# Patient Record
Sex: Female | Born: 1975 | Race: Black or African American | Hispanic: No | Marital: Single | Smoking: Never smoker
Health system: Southern US, Community
[De-identification: ages and names within clinical notes are randomized; demographics above are authoritative.]

## PROBLEM LIST (undated history)

## (undated) DIAGNOSIS — I499 Cardiac arrhythmia, unspecified: Secondary | ICD-10-CM

## (undated) DIAGNOSIS — K219 Gastro-esophageal reflux disease without esophagitis: Secondary | ICD-10-CM

## (undated) DIAGNOSIS — E049 Nontoxic goiter, unspecified: Secondary | ICD-10-CM

## (undated) DIAGNOSIS — D219 Benign neoplasm of connective and other soft tissue, unspecified: Secondary | ICD-10-CM

## (undated) DIAGNOSIS — Z87442 Personal history of urinary calculi: Secondary | ICD-10-CM

## (undated) DIAGNOSIS — R51 Headache: Secondary | ICD-10-CM

## (undated) DIAGNOSIS — E039 Hypothyroidism, unspecified: Secondary | ICD-10-CM

## (undated) HISTORY — PX: BREAST SURGERY: SHX581

## (undated) HISTORY — PX: CYSTOSCOPY W/ STONE MANIPULATION: SHX1427

---
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
## 1993-12-21 HISTORY — PX: BREAST REDUCTION SURGERY: SHX8

## 2004-12-21 HISTORY — PX: MYOMECTOMY: SHX85

## 2012-07-05 ENCOUNTER — Other Ambulatory Visit: Payer: Self-pay | Admitting: Otolaryngology

## 2012-07-05 DIAGNOSIS — E041 Nontoxic single thyroid nodule: Secondary | ICD-10-CM

## 2012-07-13 ENCOUNTER — Ambulatory Visit
Admission: RE | Admit: 2012-07-13 | Discharge: 2012-07-13 | Disposition: A | Discharge status: Home / Self Care | Payer: BC Managed Care – PPO | Source: Ambulatory Visit | Attending: Otolaryngology | Admitting: Otolaryngology

## 2012-07-13 ENCOUNTER — Other Ambulatory Visit: Payer: Self-pay | Admitting: Otolaryngology

## 2012-07-13 ENCOUNTER — Other Ambulatory Visit (HOSPITAL_COMMUNITY)
Admission: RE | Admit: 2012-07-13 | Discharge: 2012-07-13 | Disposition: A | Discharge status: Home / Self Care | Payer: BC Managed Care – PPO | Source: Ambulatory Visit | Attending: Diagnostic Radiology | Admitting: Diagnostic Radiology

## 2012-07-13 DIAGNOSIS — E041 Nontoxic single thyroid nodule: Secondary | ICD-10-CM

## 2012-07-13 DIAGNOSIS — E042 Nontoxic multinodular goiter: Secondary | ICD-10-CM

## 2012-07-13 DIAGNOSIS — E049 Nontoxic goiter, unspecified: Secondary | ICD-10-CM | POA: Insufficient documentation

## 2012-08-31 ENCOUNTER — Encounter (HOSPITAL_COMMUNITY): Payer: Self-pay | Admitting: Pharmacist

## 2012-09-01 ENCOUNTER — Encounter (HOSPITAL_COMMUNITY): Payer: Self-pay

## 2012-09-01 ENCOUNTER — Encounter (HOSPITAL_COMMUNITY)
Admission: RE | Admit: 2012-09-01 | Discharge: 2012-09-01 | Disposition: A | Discharge status: Home / Self Care | Payer: BC Managed Care – PPO | Source: Ambulatory Visit | Attending: Otolaryngology | Admitting: Otolaryngology

## 2012-09-01 HISTORY — DX: Headache: R51

## 2012-09-01 HISTORY — DX: Nontoxic goiter, unspecified: E04.9

## 2012-09-01 LAB — HCG, SERUM, QUALITATIVE: Preg, Serum: NEGATIVE

## 2012-09-01 LAB — CBC WITH DIFFERENTIAL/PLATELET
Basophils Absolute: 0 K/uL (ref 0.0–0.1)
Basophils Relative: 0 % (ref 0–1)
Eosinophils Absolute: 0.1 K/uL (ref 0.0–0.7)
Eosinophils Relative: 2 % (ref 0–5)
HCT: 39.3 % (ref 36.0–46.0)
Hemoglobin: 13 g/dL (ref 12.0–15.0)
Lymphocytes Relative: 39 % (ref 12–46)
Lymphs Abs: 3.6 K/uL (ref 0.7–4.0)
MCH: 29 pg (ref 26.0–34.0)
MCHC: 33.1 g/dL (ref 30.0–36.0)
MCV: 87.7 fL (ref 78.0–100.0)
Monocytes Absolute: 0.6 K/uL (ref 0.1–1.0)
Monocytes Relative: 7 % (ref 3–12)
Neutro Abs: 4.9 K/uL (ref 1.7–7.7)
Neutrophils Relative %: 53 % (ref 43–77)
Platelets: 379 K/uL (ref 150–400)
RBC: 4.48 MIL/uL (ref 3.87–5.11)
RDW: 14.2 % (ref 11.5–15.5)
WBC: 9.3 K/uL (ref 4.0–10.5)

## 2012-09-01 LAB — SURGICAL PCR SCREEN
MRSA, PCR: NEGATIVE
Staphylococcus aureus: NEGATIVE

## 2012-09-01 LAB — BASIC METABOLIC PANEL
BUN: 10 mg/dL (ref 6–23)
Chloride: 105 mEq/L (ref 96–112)
Creatinine, Ser: 0.67 mg/dL (ref 0.50–1.10)
GFR calc Af Amer: >90 mL/min (ref >90)

## 2012-09-01 LAB — BASIC METABOLIC PANEL WITH GFR
CO2: 25 meq/L (ref 19–32)
Calcium: 9.9 mg/dL (ref 8.4–10.5)
GFR calc non Af Amer: >90 mL/min (ref >90)
Glucose, Bld: 121 mg/dL — ABNORMAL HIGH (ref 70–99)
Potassium: 4.2 meq/L (ref 3.5–5.1)
Sodium: 141 meq/L (ref 135–145)

## 2012-09-01 NOTE — Pre-Procedure Instructions (Signed)
20 Rebecca Campos  09/01/2012   Your procedure is scheduled on:  Sept 25, 2013  Report to [facility] at 6:30 AM.  Call this number if you have problems the morning of surgery: [phone]   Remember:   Do not eat food:After Midnight.    Take these medicines the morning of surgery with A SIP OF WATER: allegra, prilosec  Do not wear jewelry, make-up or nail polish.  Do not wear lotions, powders, or perfumes. You may wear deodorant.  Do not shave 48 hours prior to surgery. Men may shave face and neck.  Do not bring valuables to the hospital.  Contacts, dentures or bridgework may not be worn into surgery.  Leave suitcase in the car. After surgery it may be brought to your room.  For patients admitted to the hospital, checkout time is 11:00 AM the day of discharge.   Patients discharged the day of surgery will not be allowed to drive home.  Name and phone number of your driver:   Special Instructions: CHG Shower Use Special Wash: 1/2 bottle night before surgery and 1/2 bottle morning of surgery.   Please read over the following fact sheets that you were given: Pain Booklet, Coughing and Deep Breathing and Surgical Site Infection Prevention

## 2012-09-13 ENCOUNTER — Other Ambulatory Visit: Payer: Self-pay | Admitting: Otolaryngology

## 2012-09-13 NOTE — H&P (Signed)
NAME:  Rebecca Campos, Rebecca Campos NO.:  [id number]  MEDICAL RECORD NO.:  [id number]  LOCATION:  [facility]                        FACILITY:  [facility]  PHYSICIAN:  Hermelinda Medicus, M.D.   DATE OF BIRTH:  [birth year]  DATE OF ADMISSION:  09/14/2012 DATE OF DISCHARGE:                             HISTORY & PHYSICAL   HISTORY OF PRESENT ILLNESS:  This patient is a 36 year old female, who we re-biopsied a large thyroid nodule in the right thyroid as well as the isthmus lesion biopsy.  The right side showed a 6 x 4.6 x 4.6 cm and the isthmus showed a 4.1 x 3 x 1.4 cm hyperplastic nodule.  The patient is feeling the fullness of that right side and also considerably shows filling of pressure in her trachea, difficulty swallowing, and finds her voice is in good condition and is stable but she still feels her voice is somewhat changed.  This apparently has been enlarging but has been a issue for approximately 6 years.  Because of this pressure situation and because of the fact of the size of this, all this area could not be needle biopsied it.  It came back as a multinodular goiter on pathology report, but is still is of concern in terms of clinical symptoms and the size of structure.  She now enters for right subtotal thyroidectomy with inclusion of the isthmus to relieve the pressure sensation hopefully will help her voice situation and she has been well warned that she could have some because of the pressure situation on the recurrent nerve, she could have some voice issues postop.  She is tentatively taking some approximately 2 weeks as she does use her voice at work her.  MEDICATIONS: 1. Naproxen 500 mg oral tablet every 12 hours as needed. 2. Acyclovir 400 mg oral tablet for fever, blister. 3. She takes Excedrin Migraine 250/250/65. 4. Omeprazole 20. 5. Fluticasone propionate 50 mcg. 6. Veramyst nasal suspension.  She has allergic rhinitis.  She has a history of esophageal  reflux, herpes simplex, migraine headache, osteoarthritis, and of course the goiter.  She has had breast surgery.  She has had tonsillectomy and uterine myomectomy.  ALLERGIES:  She has no allergies to medications.  FAMILY HISTORY:  Hepatitis, diabetes mellitus, thyroid disorder.  REVIEW OF SYSTEMS:  Essentially is some difficulty swallowing, painful swallowing, but states has had some hair loss, but her GI, GU, musculoskeletal, integument, neurologic, psychiatric, and allergy is really quite unremarkable.  PHYSICAL EXAMINATION:  GENERAL:  She is a well-nourished, well-developed female in no acute distress.  VITAL SIGNS:  Her blood pressure 140/80, her weight is 230, BMI 44.9 per Dr. Katrinka Blazing. HEENT:  Her ears are completely clear.  Tympanic membranes look excellent and move well.  Her nose is clear of any ulceration, lesion, or mass.  Nasopharynx is completely clear.  Her larynx is clear.  True cords, false cords, epiglottis, base of tongue, lateral pharyngeal walls are free of ulceration, mass, or lesion.  I cannot see a vocal cord abnormality but she states her voice is somewhat weaker than it is in the past.  She is oriented x3. NECK:  Free of any cervical adenopathy but has a  large thyroid on the right, which extends over to the isthmus region. CHEST:  Clear.  No rales, rhonchi, or wheezes. CARDIOVASCULAR:  No evidence of murmurs or gallops. ABDOMEN:  Unremarkable. EXTREMITIES:  Unremarkable. NEUROLOGIC:  Unremarkable as above stated.  Initial diagnosis is multinodular goiter, dominant on the right and mid region, history of tonsillectomy, history of breast reduction, and history of myomectomy for fibroids, history of headaches.  Family history of diabetes and hepatitis.          ______________________________ Hermelinda Medicus, M.D.     JC/MEDQ  D:  09/13/2012  T:  09/13/2012  Job:  [id number]  cc:   Corwin Levins, MD Gildardo Griffes, MD

## 2012-09-14 ENCOUNTER — Encounter (HOSPITAL_COMMUNITY): Payer: Self-pay | Admitting: *Deleted

## 2012-09-14 ENCOUNTER — Encounter (HOSPITAL_COMMUNITY)
Admission: RE | Disposition: A | Discharge status: Home / Self Care | Payer: Self-pay | Source: Ambulatory Visit | Attending: Otolaryngology

## 2012-09-14 ENCOUNTER — Encounter (HOSPITAL_COMMUNITY): Payer: Self-pay | Admitting: Otolaryngology

## 2012-09-14 ENCOUNTER — Encounter (HOSPITAL_COMMUNITY): Payer: Self-pay | Admitting: Critical Care Medicine

## 2012-09-14 ENCOUNTER — Ambulatory Visit (HOSPITAL_COMMUNITY): Payer: BC Managed Care – PPO | Admitting: Critical Care Medicine

## 2012-09-14 ENCOUNTER — Ambulatory Visit (HOSPITAL_COMMUNITY)
Admission: RE | Admit: 2012-09-14 | Discharge: 2012-09-15 | Disposition: A | Discharge status: Home / Self Care | Payer: BC Managed Care – PPO | Source: Ambulatory Visit | Attending: Otolaryngology | Admitting: Otolaryngology

## 2012-09-14 DIAGNOSIS — E049 Nontoxic goiter, unspecified: Secondary | ICD-10-CM

## 2012-09-14 DIAGNOSIS — I499 Cardiac arrhythmia, unspecified: Secondary | ICD-10-CM

## 2012-09-14 DIAGNOSIS — Z01812 Encounter for preprocedural laboratory examination: Secondary | ICD-10-CM | POA: Insufficient documentation

## 2012-09-14 HISTORY — PX: THYROIDECTOMY: SHX17

## 2012-09-14 SURGERY — THYROIDECTOMY
Anesthesia: General | Site: Neck | Laterality: Right | Wound class: Clean

## 2012-09-14 MED ORDER — DEXAMETHASONE SODIUM PHOSPHATE 4 MG/ML IJ SOLN
INTRAMUSCULAR | Status: DC | PRN
  Administered 2012-09-14: 8 mg via INTRAVENOUS

## 2012-09-14 MED ORDER — IBUPROFEN 100 MG/5ML PO SUSP
400.0000 mg | Freq: Four times a day (QID) | ORAL | Status: DC | PRN
  Administered 2012-09-15: 400 mg via ORAL
  Filled 2012-09-14: qty 20

## 2012-09-14 MED ORDER — 0.9 % SODIUM CHLORIDE (POUR BTL) OPTIME
TOPICAL | Status: DC | PRN
  Administered 2012-09-14: 1000 mL

## 2012-09-14 MED ORDER — FENTANYL CITRATE 0.05 MG/ML IJ SOLN
INTRAMUSCULAR | Status: DC | PRN
  Administered 2012-09-14: 100 ug via INTRAVENOUS
  Administered 2012-09-14: 150 ug via INTRAVENOUS
  Administered 2012-09-14 (×2): 100 ug via INTRAVENOUS
  Administered 2012-09-14: 50 ug via INTRAVENOUS

## 2012-09-14 MED ORDER — CEFAZOLIN SODIUM 1-5 GM-% IV SOLN
1.0000 g | Freq: Three times a day (TID) | INTRAVENOUS | Status: DC
  Administered 2012-09-14 – 2012-09-15 (×3): 1 g via INTRAVENOUS
  Filled 2012-09-14 (×6): qty 50

## 2012-09-14 MED ORDER — FENTANYL CITRATE 0.05 MG/ML IJ SOLN
25.0000 ug | INTRAMUSCULAR | Status: DC | PRN
  Administered 2012-09-14 (×4): 25 ug via INTRAVENOUS

## 2012-09-14 MED ORDER — BACITRACIN ZINC 500 UNIT/GM EX OINT: 1.0000 "application " | TOPICAL_OINTMENT | Freq: Three times a day (TID) | CUTANEOUS | Status: DC

## 2012-09-14 MED ORDER — PROPOFOL INFUSION 10 MG/ML OPTIME
INTRAVENOUS | Status: DC | PRN
  Administered 2012-09-14: 10:00:00 via INTRAVENOUS
  Administered 2012-09-14: 75 ug/kg/min via INTRAVENOUS

## 2012-09-14 MED ORDER — ALBUTEROL SULFATE HFA 108 (90 BASE) MCG/ACT IN AERS
INHALATION_SPRAY | RESPIRATORY_TRACT | Status: DC | PRN
  Administered 2012-09-14: 2 via RESPIRATORY_TRACT

## 2012-09-14 MED ORDER — ONDANSETRON HCL 4 MG/2ML IJ SOLN: 4.0000 mg | INTRAMUSCULAR | Status: DC | PRN

## 2012-09-14 MED ORDER — ONDANSETRON HCL 4 MG PO TABS: 4.0000 mg | ORAL_TABLET | ORAL | Status: DC | PRN

## 2012-09-14 MED ORDER — LIDOCAINE-EPINEPHRINE 1 %-1:100000 IJ SOLN
INTRAMUSCULAR | Status: AC
  Filled 2012-09-14: qty 1

## 2012-09-14 MED ORDER — LACTATED RINGERS IV SOLN
INTRAVENOUS | Status: DC | PRN
  Administered 2012-09-14 (×2): via INTRAVENOUS

## 2012-09-14 MED ORDER — MINERAL OIL LIGHT 100 % EX OIL
TOPICAL_OIL | CUTANEOUS | Status: AC
  Filled 2012-09-14: qty 25

## 2012-09-14 MED ORDER — PANTOPRAZOLE SODIUM 40 MG PO TBEC
40.0000 mg | DELAYED_RELEASE_TABLET | Freq: Every day | ORAL | Status: DC
  Administered 2012-09-15: 40 mg via ORAL
  Filled 2012-09-14: qty 1

## 2012-09-14 MED ORDER — PROPOFOL 10 MG/ML IV BOLUS
INTRAVENOUS | Status: DC | PRN
  Administered 2012-09-14 (×2): 50 mg via INTRAVENOUS
  Administered 2012-09-14: 200 mg via INTRAVENOUS

## 2012-09-14 MED ORDER — BACITRACIN ZINC 500 UNIT/GM EX OINT
TOPICAL_OINTMENT | CUTANEOUS | Status: AC
Start: 2012-09-14 — End: 2012-09-14
  Filled 2012-09-14: qty 15

## 2012-09-14 MED ORDER — MIDAZOLAM HCL 5 MG/5ML IJ SOLN
INTRAMUSCULAR | Status: DC | PRN
  Administered 2012-09-14: 2 mg via INTRAVENOUS

## 2012-09-14 MED ORDER — SUCCINYLCHOLINE CHLORIDE 20 MG/ML IJ SOLN
INTRAMUSCULAR | Status: DC | PRN
  Administered 2012-09-14: 100 mg via INTRAVENOUS

## 2012-09-14 MED ORDER — PHENYLEPHRINE HCL 10 MG/ML IJ SOLN
INTRAMUSCULAR | Status: DC | PRN
  Administered 2012-09-14: 80 ug via INTRAVENOUS
  Administered 2012-09-14: 160 ug via INTRAVENOUS
  Administered 2012-09-14: 80 ug via INTRAVENOUS

## 2012-09-14 MED ORDER — BACITRACIN ZINC 500 UNIT/GM EX OINT
TOPICAL_OINTMENT | CUTANEOUS | Status: DC | PRN
  Administered 2012-09-14: 1 via TOPICAL

## 2012-09-14 MED ORDER — BACITRACIN ZINC 500 UNIT/GM EX OINT
1.0000 "application " | TOPICAL_OINTMENT | Freq: Three times a day (TID) | CUTANEOUS | Status: DC
  Administered 2012-09-15 (×2): 1 via TOPICAL
  Filled 2012-09-14: qty 15

## 2012-09-14 MED ORDER — FENTANYL CITRATE 0.05 MG/ML IJ SOLN
INTRAMUSCULAR | Status: AC
  Filled 2012-09-14: qty 2

## 2012-09-14 MED ORDER — IBUPROFEN 100 MG/5ML PO SUSP: 400.0000 mg | Freq: Four times a day (QID) | ORAL | Status: DC | PRN

## 2012-09-14 MED ORDER — MINERAL OIL LIGHT 100 % EX OIL
TOPICAL_OIL | CUTANEOUS | Status: DC | PRN
  Administered 2012-09-14: 1 via TOPICAL

## 2012-09-14 MED ORDER — CEFAZOLIN SODIUM 1-5 GM-% IV SOLN: 1.0000 g | Freq: Three times a day (TID) | INTRAVENOUS | Status: DC

## 2012-09-14 MED ORDER — IBUPROFEN 600 MG PO TABS: 600.0000 mg | ORAL_TABLET | Freq: Four times a day (QID) | ORAL | Status: DC | PRN

## 2012-09-14 MED ORDER — SODIUM CHLORIDE 0.45 % IV SOLN
INTRAVENOUS | Status: DC
  Administered 2012-09-14: 100 mL/h via INTRAVENOUS
  Administered 2012-09-15: 03:00:00 via INTRAVENOUS

## 2012-09-14 MED ORDER — LIDOCAINE HCL (CARDIAC) 20 MG/ML IV SOLN
INTRAVENOUS | Status: DC | PRN
  Administered 2012-09-14: 50 mg via INTRAVENOUS

## 2012-09-14 MED ORDER — INFLUENZA VIRUS VACC SPLIT PF IM SUSP
0.5000 mL | INTRAMUSCULAR | Status: DC
  Filled 2012-09-14: qty 0.5

## 2012-09-14 MED ORDER — TRAMADOL HCL 50 MG PO TABS
50.0000 mg | ORAL_TABLET | Freq: Four times a day (QID) | ORAL | Status: DC
  Administered 2012-09-14 – 2012-09-15 (×4): 50 mg via ORAL
  Filled 2012-09-14 (×7): qty 1

## 2012-09-14 MED ORDER — CEFAZOLIN SODIUM-DEXTROSE 2-3 GM-% IV SOLR
INTRAVENOUS | Status: DC | PRN
  Administered 2012-09-14: 2 g via INTRAVENOUS

## 2012-09-14 MED ORDER — ARTIFICIAL TEARS OP OINT
TOPICAL_OINTMENT | OPHTHALMIC | Status: DC | PRN
  Administered 2012-09-14: 1 via OPHTHALMIC

## 2012-09-14 MED ORDER — CEFAZOLIN SODIUM-DEXTROSE 2-3 GM-% IV SOLR
INTRAVENOUS | Status: AC
  Filled 2012-09-14: qty 50

## 2012-09-14 MED ORDER — HEMOSTATIC AGENTS (NO CHARGE) OPTIME
TOPICAL | Status: DC | PRN
  Administered 2012-09-14: 1 via TOPICAL

## 2012-09-14 MED ORDER — ONDANSETRON HCL 4 MG/2ML IJ SOLN
INTRAMUSCULAR | Status: DC | PRN
  Administered 2012-09-14 (×2): 4 mg via INTRAVENOUS

## 2012-09-14 SURGICAL SUPPLY — 47 items
CANISTER SUCTION 2500CC (MISCELLANEOUS) ×2 IMPLANT
CLEANER TIP ELECTROSURG 2X2 (MISCELLANEOUS) ×2 IMPLANT
CLOTH BEACON ORANGE TIMEOUT ST (SAFETY) ×2 IMPLANT
CLSR STERI-STRIP ANTIMIC 1/2X4 (GAUZE/BANDAGES/DRESSINGS) ×2 IMPLANT
CONT SPEC 4OZ CLIKSEAL STRL BL (MISCELLANEOUS) ×2 IMPLANT
CORDS BIPOLAR (ELECTRODE) ×2 IMPLANT
COVER SURGICAL LIGHT HANDLE (MISCELLANEOUS) ×2 IMPLANT
DRAIN SNY 7 FPER (WOUND CARE) ×2 IMPLANT
ELECT COATED BLADE 2.86 ST (ELECTRODE) ×2 IMPLANT
ELECT REM PT RETURN 9FT ADLT (ELECTROSURGICAL) ×2
ELECTRODE REM PT RTRN 9FT ADLT (ELECTROSURGICAL) ×1 IMPLANT
EVACUATOR SILICONE 100CC (DRAIN) ×2 IMPLANT
GAUZE SPONGE 4X4 16PLY XRAY LF (GAUZE/BANDAGES/DRESSINGS) IMPLANT
GLOVE BIO SURGEON STRL SZ7.5 (GLOVE) ×2 IMPLANT
GLOVE BIOGEL PI IND STRL 7.5 (GLOVE) ×1 IMPLANT
GLOVE BIOGEL PI INDICATOR 7.5 (GLOVE) ×1
GLOVE SS BIOGEL STRL SZ 7.5 (GLOVE) ×1 IMPLANT
GLOVE SUPERSENSE BIOGEL SZ 7.5 (GLOVE) ×1
GLOVE SURG SS PI 7.0 STRL IVOR (GLOVE) ×2 IMPLANT
GLOVE SURG SS PI 7.5 STRL IVOR (GLOVE) ×4 IMPLANT
GOWN PREVENTION PLUS XLARGE (GOWN DISPOSABLE) ×4 IMPLANT
GOWN STRL NON-REIN LRG LVL3 (GOWN DISPOSABLE) ×6 IMPLANT
HEMOSTAT SURGICEL 2X14 (HEMOSTASIS) IMPLANT
KIT BASIN OR (CUSTOM PROCEDURE TRAY) ×2 IMPLANT
KIT ROOM TURNOVER OR (KITS) ×2 IMPLANT
LOCATOR NERVE 3 VOLT (DISPOSABLE) IMPLANT
NS IRRIG 1000ML POUR BTL (IV SOLUTION) ×2 IMPLANT
PAD ARMBOARD 7.5X6 YLW CONV (MISCELLANEOUS) ×4 IMPLANT
PENCIL BUTTON HOLSTER BLD 10FT (ELECTRODE) ×2 IMPLANT
SPECIMEN JAR MEDIUM (MISCELLANEOUS) ×2 IMPLANT
SPONGE GAUZE 4X4 12PLY (GAUZE/BANDAGES/DRESSINGS) ×2 IMPLANT
SPONGE INTESTINAL PEANUT (DISPOSABLE) IMPLANT
STRIP CLOSURE SKIN 1/2X4 (GAUZE/BANDAGES/DRESSINGS) IMPLANT
SUT CHROMIC 2 0 SH (SUTURE) ×2 IMPLANT
SUT ETHILON 5 0 P 3 18 (SUTURE)
SUT NYLON ETHILON 5-0 P-3 1X18 (SUTURE) IMPLANT
SUT SILK 2 0 (SUTURE) ×1
SUT SILK 2 0 FS (SUTURE) ×2 IMPLANT
SUT SILK 2 0 SH CR/8 (SUTURE) ×2 IMPLANT
SUT SILK 2-0 18XBRD TIE 12 (SUTURE) ×1 IMPLANT
SUT SILK 3 0 (SUTURE)
SUT SILK 3-0 18XBRD TIE 12 (SUTURE) IMPLANT
TOWEL OR 17X24 6PK STRL BLUE (TOWEL DISPOSABLE) ×2 IMPLANT
TOWEL OR 17X26 10 PK STRL BLUE (TOWEL DISPOSABLE) ×2 IMPLANT
TRAY ENT MC OR (CUSTOM PROCEDURE TRAY) ×2 IMPLANT
TUBE ENDOTRAC EMG 7X10.2 (MISCELLANEOUS) ×2 IMPLANT
WATER STERILE IRR 1000ML POUR (IV SOLUTION) ×2 IMPLANT

## 2012-09-14 NOTE — Op Note (Signed)
NAME:  LANGLEY, FLATLEY NO.:  [id number]  MEDICAL RECORD NO.:  [id number]  LOCATION:  [facility]                         FACILITY:  [facility]  PHYSICIAN:  Hermelinda Medicus, M.D.   DATE OF BIRTH:  [birth year]  DATE OF PROCEDURE: DATE OF DISCHARGE:                              OPERATIVE REPORT   PREOPERATIVE DIAGNOSIS:  Right thyroid goiter with considerable pressure on the trachea and esophagus, 6 x 4.6 x 4.6 cm with involvement of the isthmus.  POSTOPERATIVE DIAGNOSIS:  Right thyroid goiter with considerable pressure on the trachea and esophagus, 6 x 4.6 x 4.6 cm with involvement of the isthmus.  OPERATION:  Right subtotal thyroidectomy.  OPERATOR:  Hermelinda Medicus, MD  ASSISTANT:  Kristine Garbe. Ezzard Standing, MD  PROCEDURE:  The patient was placed in a supine position.  A shoulder roll was placed and he had the back in the midline and the neck was prepped with Betadine x3.  The usual neck drape and head drape was used. Once the patient was positioned and also she was intubated using the NIMS endotracheal tube, using also the glide visualization.  Once this was completed, the patient was prepped and draped as above stated and then an incision was made in the neck crease as a standard thyroid incision.  This was carried down through the skin, platysma, musculature down to the strap muscles.  The strap muscles were identified and the very large lesion was also identified that was pressing the trachea over toward the left.  We mobilized the strap muscles and dissected back and placed the thyroid retractor. Then using the [employer] retractors, we were able to expose this goiter more effectively.  The size of this considering it had been there for approximately 6 years was very impressive.  She has had some slight voice issues with this as well as swallowing issues as well as a feeling of pressure in her trachea and this could be well understood when we see the size of this lesion.   The dissection was carried down to the trachea midline and the superior trachea and cricoid region.  The dissection was then carried inferiorly and all hemostasis was established with Bovie electrocoagulation and 2-0 ties were used otherwise.  As we dissected, we found the parathyroid and they were saved and we worked away around inferiorly and then laterally and around the backside of this very large mass.  We then worked our way up to superiorly across Berry's ligament area and were very careful to stay away from the trachea in this area.  Visualization was somewhat difficult because of the size of this mass but we stayed away from that key area around Berry's ligament and keeping the recurrent nerve well guarded.  We worked back around the tracheoesophageal groove area, again being very careful around the recurrent nerve region being dissecting only right on the mass and dissected it back off in an area that we were able to elevate the inferior pole and then were able to see more clearly the superior pole and dissect further again saving what appeared to be the parathyroid tissue.  As we developed this, we used the bipolar Bovie electrocoagulation as well  as the standard Bovie.  Hemostasis was well controlled throughout this procedure.  The loss of blood was essentially 75 mL and once we dissected the entire lesion free, then we could visualize very well and did not try to identify fully the recurrent nerve as there was considerable pressure on this area and we felt that the minimal trauma would be appropriate.  The hemostasis was established with Bovie electrocoagulation and then the bipolar cautery.  The trachea and Berry's ligament area was having no area of concern or problem and then once the hemostasis was completely established, we placed some Surgicel inferiorly and Surgicel superiorly because of the straight size of this lesion.  We then closed with 2-0 chromic catgut and then  5-0 Ethilon.  We placed a Jackson-Pratt flat multi-perforated drain and the loss of blood during this placement of the drain was minimal.  Once we closed the incision, we placed Steri-Strips.  The drain was sutured in place with a 2-0 silk.  The patient is aware of the risks and gains. Because of the size of this lesion, we were concerned about the recurrent nerve pressure.  There was pressure on the esophagus as well as the trachea and therefore she may have some further voice issues of which she is aware.  However, she waited for approximately 6 years on this, trying to avoid the surgery but the pressure there just became too much of a problem.  Her followup of course will be in the hospital.  She is somewhat overweight.  She has a BMI of 44.9, weight 230 and with this large lesion, we may place her in step-down.  We will decide that in the recovery area.          ______________________________ Hermelinda Medicus, M.D.     JC/MEDQ  D:  09/14/2012  T:  09/14/2012  Job:  [id number]  cc:   Corwin Levins, MD Gildardo Griffes, MD Kristine Garbe. Ezzard Standing, M.D.

## 2012-09-14 NOTE — Anesthesia Postprocedure Evaluation (Signed)
  Anesthesia Post-op Note  Patient: Rebecca Campos  Procedure(s) Performed: Procedure(s) (LRB) with comments: THYROIDECTOMY (Right) - Right subtotal thyroidectomy   Patient Location: PACU  Anesthesia Type: General  Level of Consciousness: awake  Airway and Oxygen Therapy: Patient Spontanous Breathing  Post-op Pain: mild  Post-op Assessment: Post-op Vital signs reviewed  Post-op Vital Signs: Reviewed  Complications: No apparent anesthesia complications

## 2012-09-14 NOTE — Transfer of Care (Signed)
Immediate Anesthesia Transfer of Care Note  Patient: Rebecca Campos  Procedure(s) Performed: Procedure(s) (LRB) with comments: THYROIDECTOMY (Right) - Right subtotal thyroidectomy   Patient Location: PACU  Anesthesia Type: General  Level of Consciousness: awake, alert  and oriented  Airway & Oxygen Therapy: Patient Spontanous Breathing and Patient connected to nasal cannula oxygen  Post-op Assessment: Report given to PACU RN, Post -op Vital signs reviewed and stable and Patient moving all extremities X 4  Post vital signs: Reviewed and stable  Complications: No apparent anesthesia complications

## 2012-09-14 NOTE — Plan of Care (Signed)
Problem: Phase I Progression Outcomes Goal: OOB as tolerated unless otherwise ordered Outcome: Completed/Met Date Met:  09/14/12 Pt ambulating to bathroom without complications

## 2012-09-14 NOTE — Op Note (Signed)
09/14/2012  11:02 AM  PATIENT:  Rebecca Campos  36 y.o. female  PRE-OPERATIVE DIAGNOSIS:  Large thyroid nodule  POST-OPERATIVE DIAGNOSIS:  Large thyroid nodule  PROCEDURE:  Procedure(s): THYROIDECTOMY  SURGEON:  Surgeon(s): Keturah Barre, MD Drema Halon, MD   DICTATION JOB # [id number]

## 2012-09-14 NOTE — Anesthesia Procedure Notes (Signed)
Procedure Name: Intubation Date/Time: 09/14/2012 8:39 AM Performed by: Elon Alas Pre-anesthesia Checklist: Patient identified, Timeout performed, Emergency Drugs available, Suction available and Patient being monitored Patient Re-evaluated:Patient Re-evaluated prior to inductionOxygen Delivery Method: Circle system utilized Preoxygenation: Pre-oxygenation with 100% oxygen Intubation Type: IV induction Ventilation: Mask ventilation without difficulty Grade View: Grade I Tube type: Oral Number of attempts: 1 Airway Equipment and Method: Stylet Placement Confirmation: positive ETCO2,  ETT inserted through vocal cords under direct vision and breath sounds checked- equal and bilateral Secured at: 22 cm Tube secured with: Tape Dental Injury: Teeth and Oropharynx as per pre-operative assessment and Bloody posterior oropharynx  Comments: Pt had edematous, friable tissue in posterior hypopharynx,

## 2012-09-14 NOTE — Progress Notes (Signed)
Patient doing well with excellent voice minimal pain  JP drain working well with 30cc drainage  No neck swelling Placeed in step down as snoring history and a large thyroid mass [id number] mm

## 2012-09-14 NOTE — Anesthesia Preprocedure Evaluation (Addendum)
Anesthesia Evaluation  Patient identified by MRN, date of birth, ID band Patient awake    Reviewed: Allergy & Precautions, H&P , NPO status , Patient's Chart, lab work & pertinent test results  Airway Mallampati: II TM Distance: >3 FB Neck ROM: Full    Dental  (+) Dental Advisory Given and Teeth Intact   Pulmonary  breath sounds clear to auscultation        Cardiovascular Rhythm:Regular Rate:Normal     Neuro/Psych  Headaches,    GI/Hepatic GERD-  Medicated,  Endo/Other  Morbid obesity  Renal/GU      Musculoskeletal   Abdominal   Peds  Hematology   Anesthesia Other Findings   Reproductive/Obstetrics                          Anesthesia Physical Anesthesia Plan  ASA: II  Anesthesia Plan: General   Post-op Pain Management:    Induction: Intravenous  Airway Management Planned: Oral ETT  Additional Equipment:   Intra-op Plan:   Post-operative Plan: Extubation in OR  Informed Consent: I have reviewed the patients History and Physical, chart, labs and discussed the procedure including the risks, benefits and alternatives for the proposed anesthesia with the patient or authorized representative who has indicated his/her understanding and acceptance.   Dental advisory given  Plan Discussed with: Anesthesiologist, Surgeon and CRNA  Anesthesia Plan Comments:        Anesthesia Quick Evaluation

## 2012-09-14 NOTE — Preoperative (Signed)
Beta Blockers   Reason not to administer Beta Blockers:Not Applicable 

## 2012-09-15 ENCOUNTER — Other Ambulatory Visit: Payer: Self-pay

## 2012-09-15 ENCOUNTER — Encounter (HOSPITAL_COMMUNITY): Payer: Self-pay | Admitting: Otolaryngology

## 2012-09-15 DIAGNOSIS — I471 Supraventricular tachycardia: Secondary | ICD-10-CM

## 2012-09-15 DIAGNOSIS — I472 Ventricular tachycardia: Secondary | ICD-10-CM

## 2012-09-15 LAB — BASIC METABOLIC PANEL
BUN: 7 mg/dL (ref 6–23)
CO2: 27 mEq/L (ref 19–32)
GFR calc non Af Amer: >90 mL/min (ref >90)
Glucose, Bld: 89 mg/dL (ref 70–99)
Potassium: 3.7 mEq/L (ref 3.5–5.1)

## 2012-09-15 MED ORDER — CEFUROXIME AXETIL 250 MG PO TABS: 250.0000 mg | ORAL_TABLET | Freq: Two times a day (BID) | ORAL | Status: DC

## 2012-09-15 MED ORDER — TRAMADOL HCL 50 MG PO TABS: 50.0000 mg | ORAL_TABLET | Freq: Four times a day (QID) | ORAL | Status: DC

## 2012-09-15 MED ORDER — PERFLUTREN LIPID MICROSPHERE
1.0000 mL | INTRAVENOUS | Status: AC | PRN
  Administered 2012-09-15: 2 mL via INTRAVENOUS
  Administered 2012-09-15: 1 mL via INTRAVENOUS
  Administered 2012-09-15: 2 mL via INTRAVENOUS
  Filled 2012-09-15: qty 10

## 2012-09-15 NOTE — Plan of Care (Signed)
Problem: Phase II Progression Outcomes Goal: Progress activity as tolerated unless otherwise ordered Outcome: Completed/Met Date Met:  09/15/12 Pt ambulated in room and to bathroom multiple times without complications Goal: Vital signs remain stable Outcome: Not Progressing Pt had run of VTACH this am then converted back to NSR.  MD aware and monitoring

## 2012-09-15 NOTE — Progress Notes (Signed)
Pt was discharged home by MD.  Pt vital signs prior to d/c are documented in doc flow sheets.  Pt belongings have been returned to pt, discharge teaching has been given and at the current time there are no questions nor concerns that are unaddressed.  Pt is alert and oriented and will be transported via wheelchair.

## 2012-09-15 NOTE — Discharge Summary (Signed)
  Discharge summery [phone]

## 2012-09-15 NOTE — Progress Notes (Signed)
Echocardiogram 2D Echocardiogram has been performed.  Rebecca Campos 09/15/2012, 12:00 PM

## 2012-09-15 NOTE — Progress Notes (Signed)
Patient did very well at surgery JP drain showed 50 cc drainage Therefore JP removed Voice is excellent Planned to send patient home Now patient had 5 seconds V tac Cardiologist called

## 2012-09-15 NOTE — Consult Note (Signed)
Patient ID: Rebecca Campos MRN: [id number], DOB/AGE: Jul 27, 1976   Admit date: 09/14/2012 Date of Consult: @TODAY @  Primary Physician: Gildardo Griffes, MD Primary Cardiologist: New   Problem List: Past Medical History  Diagnosis Date  . Headache   . Goiter     Past Surgical History  Procedure Date  . Tonsillectomy   . Breast reduction surgery   . Myomectomy   . Breast surgery      Allergies:  Allergies  Allergen Reactions  . Percocet (Oxycodone-Acetaminophen) Nausea And Vomiting    HPI:  Patient is a 36 year old who we are asked to see Re NSVT  Patient admitted on 9/24 after removal of R subtotal thyroidectomy.  No prior cardiac history.   This morning she sensed heart palpitations.  Thought she turned in bed wrong.  No dizziness.  No SOB  She denies having this in the past. Prior to admit she was active.  Able to walk stairs without a problem.  Denies CP  No palpitations.  No dizziness.    Family history signif for CHF in paternal aunt and grandmother    Inpatient Medications:    . bacitracin  1 application Topical Q8H  .  ceFAZolin (ANCEF) IV  1 g Intravenous Q8H  . fentaNYL      . influenza  inactive virus vaccine  0.5 mL Intramuscular Tomorrow-1000  . pantoprazole  40 mg Oral Q1200  . traMADol  50 mg Oral Q6H  . DISCONTD: bacitracin  1 application Topical Q8H  . DISCONTD: bacitracin  1 application Topical Q8H  . DISCONTD:  ceFAZolin (ANCEF) IV  1 g Intravenous Q8H  . DISCONTD:  ceFAZolin (ANCEF) IV  1 g Intravenous Q8H    History reviewed. No pertinent family history.   History   Social History  . Marital Status: Single    Spouse Name: N/A    Number of Children: N/A  . Years of Education: N/A   Occupational History  . Not on file.   Social History Main Topics  . Smoking status: Never Smoker   . Smokeless tobacco: Not on file  . Alcohol Use: No  . Drug Use: No  . Sexually Active:    Other Topics Concern  . Not on file   Social History Narrative    . No narrative on file     Review of Systems: All other systems reviewed and are otherwise negative except as noted above.  Physical Exam: Filed Vitals:   09/15/12 0855  BP: 119/73  Pulse: 78  Temp:   Resp: 16    Intake/Output Summary (Last 24 hours) at 09/15/12 0942 Last data filed at 09/15/12 0900  Gross per 24 hour  Intake   3720 ml  Output    100 ml  Net   3620 ml    General: Well developed, well nourished, in no acute distress. Head: Normocephalic, atraumatic, sclera non-icteric Neck  Ice pack on neck.  Steri-strips placed. Lungs: Clear bilaterally to auscultation without wheezes, rales, or rhonchi. Breathing is unlabored. Heart: RRR with S1 S2. No murmurs, rubs, or gallops appreciated. Abdomen: Soft, non-tender, non-distended with normoactive bowel sounds. No hepatomegaly. No rebound/guarding. No obvious abdominal masses. Msk:  Strength and tone appears normal for age. Extremities: No clubbing, cyanosis or edema.  Distal pedal pulses are 2+ and equal bilaterally. Neuro: Alert and oriented X 3. Moves all extremities spontaneously. Psych:  Responds to questions appropriately with a normal affect.  Labs: No results found for this or any previous visit (  from the past 24 hour(s)).  Radiology/Studies: No results found.  EKG:  NSR.  78 bpm.  QT 442  Tele:  8:53  15 beats NSVT   ASSESSMENT AND PLAN:  Patient is a 36 yo with no prior cardiac history.  S/p thyroid surgery yesterday. No abnormal hemodynamics reported.  This AM had spell on NSVT.  Felt palpitations.  Has not had before.   Exam without evidence of CHF.  Patient asymptomatic now. EKG normal  Recomm:  Check K, Ca.  Would get echo to evaluate LV.  This can be done as outpatient or can get as outpatient.  If EF normal and labs normal I would recomm 48 hour holter monitor to evaluate. If LVEF depressed with need further evaluation after recovers from surgery. I do not think above episode is from post op  coronary ischemia.     Scherrie Merritts 09/15/2012, 9:42 AM

## 2012-09-15 NOTE — Progress Notes (Addendum)
1000:  EKG strip shared with Dr. Tenny Craw of [facility]  0932: Nurse was called by monitor tech to inform of change in pt heart rhythm.  Pt previously NSR, pt had run of [facility] with bundle branch for less than minute during 0853, then converted back to NSR.  Pt was assessed, complained of palpations but denied any other symptoms.  Vitals signs are documented post episode on doc flow sheets.   MD Haroldine Laws was contacted and arrived to assess pt, reviewed EKG strip of event; instructed nurse to continue to monitor, MD to contact [facility] for consult.  Nurse will continue to monitor.

## 2012-09-15 NOTE — Progress Notes (Addendum)
I have reviewed BMET which is normal.  Echo is also normal.  I think it is OK to discharge patient to home.  I will arrange for her to be set up with an outpatient 48 hour holter monitor and clinic appt after  Appt is  October 24 at 9:45 [facility], [address] [place], [phone])  OK to d/c home today.  Dietrich Pates 1:27 PM 09/15/2012

## 2012-09-16 NOTE — Discharge Summary (Signed)
NAME:  ANALIA, ZUK NO.:  [id number]  MEDICAL RECORD NO.:  [id number]  LOCATION:  2601                         FACILITY:  [facility]  PHYSICIAN:  Hermelinda Medicus, M.D.   DATE OF BIRTH:  Jun 25, 1976  DATE OF ADMISSION:  09/14/2012 DATE OF DISCHARGE:  09/15/2012                              DISCHARGE SUMMARY   This patient is a 36 year old female, who has had a very large thyroid nodule, especially on the right side involving the isthmus as well, 6 x 4.6 x 4.6 cm, isthmus 4.1 x 3 x 1.4 cm and had needle biopsies done apparently 3 years ago, then more recently to make sure that she does not have a tumor issue and she did show just multinodular goiter, however, she has had fullness feeling on the right side of her neck with pressure, difficulty swallowing.  She has had her voice changed slightly and she has also had a pressure sensation around her trachea.  She now wishes to get this removed, is increasing in size.  She has a history of distant relative having thyroid cancer, and she now entered for a right subtotal thyroidectomy.  There was also worry about the pressure over ulnar recurrent nerve.  Her medications in the past were Naprosyn 500, acyclovir 400, Excedrin Migraine, omeprazole 20, fluticasone propionate 50, and Veramyst nasal suspension spray.  She does have allergic rhinitis and also history of esophageal reflux, herpes simplex, migraine headache, osteoarthritis, and of course the goiter situation.  She has had breast surgery in the past along with tonsillectomy and uterine myomectomy.  She stated she has nausea and vomiting with oxycodone.  FAMILY HISTORY:  Hepatitis, diabetes mellitus, and thyroid disorder.  She has difficulty swallowing on review of systems and fullness around her trachea, but otherwise she feels she is in good condition.  No musculoskeletal, integument, neurologic, psychiatric, or allergy issues except above mentioned.  PHYSICAL  EXAMINATION:  GENERAL:  Well-nourished, well-developed female, moderately overweight. VITAL SIGNS:  BMI of 44.9, weight is 230, blood pressure 140/80. HEENT:  Ears are completely clear.  Tympanic membranes look excellent. Nose is clear.  Her throat shows possibly a slight shift of her trachea to the left on laryngeal exam but true cords, false cords, epiglottis, base of tongue, lateral pharyngeal walls are free of any ulceration, mass, or lesion. CHEST:  Clear.  No rales, rhonchi, or wheezes. CARDIOVASCULAR:  No evidence of murmurs or gallops.  EKG in the past has been within normal limits.  She underwent a subtotal thyroidectomy, right side under general endotracheal anesthesia on the 25th and did very well postoperatively.  Her voice was excellent.  She was placed in step-down unit because she is overweight.  She did have a snoring history and with that work around her trachea was such a large lesion.  It was felt that I think her airway should be just more observed and she was observed on pulse oximeter on room air and she never fell below 90.  She in fact was in the mid to high 90s on normal room air.  She also did very well.  Her voice is excellent.  Her blood pressure was stable in the  140/66, pulse 78.  She had a very excellent postop course.  I removed the Jackson-Pratt drain this morning where she had approximately 50 mL of serosanguineous drainage.  LABORATORY DATA:  Potassium 4.2, sodium 141, chloride 105, BUN 10.  She had a glucose that was slightly elevated at 121.  White count 9.3, hematocrit 13.0, hemoglobin 39.3.  Differential was within normal limits.  MRSA was negative, but this morning she developed a V-tach episode and therefore we called cardiology evaluation.  EKG was deemed as normal after it was rechecked.  No prior cardiac history.  She did have left ventricular function.  Ejection fraction was 55-60%, but her vena cava was normal.  Her aorta was all evaluated  with root diameter 29 mm.  Mitral valve was considered all normal.  Inferior vena cava normal. Echocardiogram was essentially read out as normal by Dr. Dietrich Pates and also this evaluation was done by Dr. Elease Hashimoto.  Therefore, she will be discharged now into outpatient followup.  Her final diagnosis is that of normal cardiac evaluation.  They are recommending a 48 hour evaluation of her cardiac status via Holter monitor.  Her only medication that she will be sent home with would be Ultram 50 mg q.6 hours p.r.n.  DIAGNOSES:  Large right thyroid multinodular goiter, history of hepatitis, history of diabetes mellitus, and thyroid disorder in her family, but her history is one of herpes simplex, esophageal reflux, and migraine headache.          ______________________________ Hermelinda Medicus, M.D.     JC/MEDQ  D:  09/15/2012  T:  09/16/2012  Job:  [id number]  cc:   Corwin Levins, MD Gildardo Griffes, MD Pricilla Riffle, MD, [facility]

## 2012-10-13 ENCOUNTER — Ambulatory Visit: Payer: BC Managed Care – PPO | Admitting: Internal Medicine

## 2012-10-21 ENCOUNTER — Encounter: Payer: BC Managed Care – PPO | Admitting: Internal Medicine

## 2012-10-31 ENCOUNTER — Telehealth: Payer: Self-pay | Admitting: *Deleted

## 2012-10-31 NOTE — Telephone Encounter (Signed)
A call was made to Ms. Rebecca Campos to reschedule holter monitor and office visit that was canceled by [name]. Per patient she is feeling much better and has decided not to reschedule monitor/office visit at this time. She will call us if she starts feeling her heart racing. I will forward this message to [facility] for review.

## 2014-06-05 ENCOUNTER — Other Ambulatory Visit (HOSPITAL_COMMUNITY): Payer: Self-pay | Admitting: Otolaryngology

## 2014-06-05 ENCOUNTER — Other Ambulatory Visit: Payer: Self-pay | Admitting: Otolaryngology

## 2014-06-05 DIAGNOSIS — E041 Nontoxic single thyroid nodule: Secondary | ICD-10-CM

## 2014-06-13 ENCOUNTER — Other Ambulatory Visit (HOSPITAL_COMMUNITY)
Admission: RE | Admit: 2014-06-13 | Discharge: 2014-06-13 | Disposition: A | Discharge status: Home / Self Care | Payer: PRIVATE HEALTH INSURANCE | Source: Ambulatory Visit | Attending: Interventional Radiology | Admitting: Interventional Radiology

## 2014-06-13 ENCOUNTER — Ambulatory Visit
Admission: RE | Admit: 2014-06-13 | Discharge: 2014-06-13 | Disposition: A | Discharge status: Home / Self Care | Payer: PRIVATE HEALTH INSURANCE | Source: Ambulatory Visit | Attending: Otolaryngology | Admitting: Otolaryngology

## 2014-06-13 DIAGNOSIS — E041 Nontoxic single thyroid nodule: Secondary | ICD-10-CM

## 2015-04-09 ENCOUNTER — Other Ambulatory Visit: Payer: Self-pay | Admitting: Otolaryngology

## 2015-04-30 NOTE — Pre-Procedure Instructions (Addendum)
Rebecca Campos  04/30/2015   Your procedure is scheduled on:  Wednesday, May 08, 2015  Report to [facility] Admitting at 6:30 AM.  Call this number if you have problems the morning of surgery: [phone]   Remember:   Do not eat food or drink liquids after midnight Tuesday, May 07, 2015   Take these medicines the morning of surgery with A SIP OF WATER: ,  omeprazole (PRILOSEC OTC), if needed:acetaminophen (TYLENOL) for pain,   Stop taking Aspirin, vitamins and herbal medications such as Melatonin. Do not take any NSAIDs ie: Ibuprofen, Advil, Naproxen or any medication containing Aspirin such as aspirin-acetaminophen-caffeine (EXCEDRIN MIGRAINE) ; stop now.   Do not wear jewelry, make-up or nail polish.  Do not wear lotions, powders, or perfumes. You may not wear deodorant.  Do not shave 48 hours prior to surgery.   Do not bring valuables to the hospital.  [facility] is not responsible for any belongings or valuables.               Contacts, dentures or bridgework may not be worn into surgery.  Leave suitcase in the car. After surgery it may be brought to your room.  For patients admitted to the hospital, discharge time is determined by your treatment team.               Patients discharged the day of surgery will not be allowed to drive home.  Name and phone number of your driver:   Special Instructions:  Special Instructions:Special Instructions: [facility] - Preparing for Surgery  Before surgery, you can play an important role.  Because skin is not sterile, your skin needs to be as free of germs as possible.  You can reduce the number of germs on you skin by washing with CHG (chlorahexidine gluconate) soap before surgery.  CHG is an antiseptic cleaner which kills germs and bonds with the skin to continue killing germs even after washing.  Please DO NOT use if you have an allergy to CHG or antibacterial soaps.  If your skin becomes reddened/irritated stop using the  CHG and inform your nurse when you arrive at Short Stay.  Do not shave (including legs and underarms) for at least 48 hours prior to the first CHG shower.  You may shave your face.  Please follow these instructions carefully:   1.  Shower with CHG Soap the night before surgery and the morning of Surgery.  2.  If you choose to wash your hair, wash your hair first as usual with your normal shampoo.  3.  After you shampoo, rinse your hair and body thoroughly to remove the Shampoo.  4.  Use CHG as you would any other liquid soap.  You can apply chg directly  to the skin and wash gently with scrungie or a clean washcloth.  5.  Apply the CHG Soap to your body ONLY FROM THE NECK DOWN.  Do not use on open wounds or open sores.  Avoid contact with your eyes, ears, mouth and genitals (private parts).  Wash genitals (private parts) with your normal soap.  6.  Wash thoroughly, paying special attention to the area where your surgery will be performed.  7.  Thoroughly rinse your body with warm water from the neck down.  8.  DO NOT shower/wash with your normal soap after using and rinsing off the CHG Soap.  9.  Pat yourself dry with a clean towel.  10.  Wear clean pajamas.            11.  Place clean sheets on your bed the night of your first shower and do not sleep with pets.  Day of Surgery  Do not apply any lotions/deodorants the morning of surgery.  Please wear clean clothes to the hospital/surgery center.   Please read over the following fact sheets that you were given: Pain Booklet, Coughing and Deep Breathing and Surgical Site Infection Prevention

## 2015-05-01 ENCOUNTER — Encounter (HOSPITAL_COMMUNITY)
Admission: RE | Admit: 2015-05-01 | Discharge: 2015-05-01 | Disposition: A | Discharge status: Home / Self Care | Payer: PRIVATE HEALTH INSURANCE | Source: Ambulatory Visit | Attending: Otolaryngology | Admitting: Otolaryngology

## 2015-05-01 ENCOUNTER — Encounter (HOSPITAL_COMMUNITY): Payer: Self-pay

## 2015-05-01 DIAGNOSIS — R9431 Abnormal electrocardiogram [ECG] [EKG]: Secondary | ICD-10-CM | POA: Insufficient documentation

## 2015-05-01 DIAGNOSIS — Z01812 Encounter for preprocedural laboratory examination: Secondary | ICD-10-CM | POA: Diagnosis not present

## 2015-05-01 DIAGNOSIS — K219 Gastro-esophageal reflux disease without esophagitis: Secondary | ICD-10-CM | POA: Diagnosis not present

## 2015-05-01 DIAGNOSIS — E039 Hypothyroidism, unspecified: Secondary | ICD-10-CM | POA: Insufficient documentation

## 2015-05-01 DIAGNOSIS — Z01818 Encounter for other preprocedural examination: Secondary | ICD-10-CM | POA: Diagnosis present

## 2015-05-01 DIAGNOSIS — E079 Disorder of thyroid, unspecified: Secondary | ICD-10-CM | POA: Diagnosis not present

## 2015-05-01 HISTORY — DX: Cardiac arrhythmia, unspecified: I49.9

## 2015-05-01 HISTORY — DX: Personal history of urinary calculi: Z87.442

## 2015-05-01 HISTORY — DX: Hypothyroidism, unspecified: E03.9

## 2015-05-01 HISTORY — DX: Gastro-esophageal reflux disease without esophagitis: K21.9

## 2015-05-01 LAB — CBC
HEMATOCRIT: 39.8 % (ref 36.0–46.0)
HEMOGLOBIN: 12.8 g/dL (ref 12.0–15.0)
MCH: 28.1 pg (ref 26.0–34.0)
MCHC: 32.2 g/dL (ref 30.0–36.0)
MCV: 87.3 fL (ref 78.0–100.0)
Platelets: 353 10*3/uL (ref 150–400)
RBC: 4.56 MIL/uL (ref 3.87–5.11)
RDW: 13.9 % (ref 11.5–15.5)
WBC: 4.8 10*3/uL (ref 4.0–10.5)

## 2015-05-01 LAB — BASIC METABOLIC PANEL
Anion gap: 12 (ref 5–15)
BUN: 7 mg/dL (ref 6–20)
CHLORIDE: 103 mmol/L (ref 101–111)
CO2: 23 mmol/L (ref 22–32)
Calcium: 9.1 mg/dL (ref 8.9–10.3)
Creatinine, Ser: 0.68 mg/dL (ref 0.44–1.00)
GFR calc Af Amer: >60 mL/min (ref >60)
GFR calc non Af Amer: >60 mL/min (ref >60)
GLUCOSE: 106 mg/dL — AB (ref 70–99)
Potassium: 3.7 mmol/L (ref 3.5–5.1)
SODIUM: 138 mmol/L (ref 135–145)

## 2015-05-01 LAB — HCG, SERUM, QUALITATIVE: Preg, Serum: NEGATIVE

## 2015-05-01 NOTE — Progress Notes (Addendum)
Anesthesia Chart Review:  Pt is 39 year old female scheduled for completion thyroidectomy for L thyroid mass on 05/08/2015 with Dr. Benjamine Mola.   PMH includes: hypothyroidism, SVT (post-op from thyroidectomy in 2013), GERD. Never smoker. BMI 43. S/p thyroidectomy 09/14/12.  Preoperative labs reviewed.    EKG: NSR. Right axis deviation. Cannot rule out Anterior infarct, age undetermined. ST & T wave abnormality, consider inferior ischemia. Appears to have limb lead reversal. Will repeat EKG DOS.   Echo 09/15/2012: Left ventricle: The cavity size was normal. Systolic function was normal. The estimated ejection fraction was in the range of 55% to 60%. Wall motion was normal; there were no regional wall motion abnormalities.  If EKG acceptable DOS, I anticipate pt can proceed as scheduled.   Willeen Cass, FNP-BC [facility]/Anesthesiology Phone: [phone] 05/02/2015 12:44 PM

## 2015-05-08 ENCOUNTER — Encounter (HOSPITAL_COMMUNITY)
Admission: RE | Disposition: A | Discharge status: Home / Self Care | Payer: Self-pay | Source: Ambulatory Visit | Attending: Otolaryngology

## 2015-05-08 ENCOUNTER — Ambulatory Visit (HOSPITAL_COMMUNITY)
Admission: RE | Admit: 2015-05-08 | Discharge: 2015-05-09 | Disposition: A | Discharge status: Home / Self Care | Payer: PRIVATE HEALTH INSURANCE | Source: Ambulatory Visit | Attending: Otolaryngology | Admitting: Otolaryngology

## 2015-05-08 ENCOUNTER — Encounter (HOSPITAL_COMMUNITY): Payer: Self-pay | Admitting: *Deleted

## 2015-05-08 ENCOUNTER — Ambulatory Visit (HOSPITAL_COMMUNITY): Payer: PRIVATE HEALTH INSURANCE | Admitting: Emergency Medicine

## 2015-05-08 ENCOUNTER — Ambulatory Visit (HOSPITAL_COMMUNITY): Payer: PRIVATE HEALTH INSURANCE | Admitting: Anesthesiology

## 2015-05-08 DIAGNOSIS — Z6841 Body Mass Index (BMI) 40.0 and over, adult: Secondary | ICD-10-CM | POA: Insufficient documentation

## 2015-05-08 DIAGNOSIS — E039 Hypothyroidism, unspecified: Secondary | ICD-10-CM | POA: Insufficient documentation

## 2015-05-08 DIAGNOSIS — E890 Postprocedural hypothyroidism: Secondary | ICD-10-CM

## 2015-05-08 DIAGNOSIS — E042 Nontoxic multinodular goiter: Secondary | ICD-10-CM | POA: Diagnosis not present

## 2015-05-08 HISTORY — PX: THYROIDECTOMY: SHX17

## 2015-05-08 LAB — CALCIUM
CALCIUM: 8.9 mg/dL (ref 8.9–10.3)
Calcium: 9.7 mg/dL (ref 8.9–10.3)

## 2015-05-08 SURGERY — THYROIDECTOMY
Anesthesia: General | Site: Neck

## 2015-05-08 MED ORDER — MIDAZOLAM HCL 2 MG/2ML IJ SOLN
INTRAMUSCULAR | Status: AC
  Filled 2015-05-08: qty 2

## 2015-05-08 MED ORDER — PROPOFOL 10 MG/ML IV BOLUS
INTRAVENOUS | Status: AC
  Filled 2015-05-08: qty 20

## 2015-05-08 MED ORDER — ONDANSETRON HCL 4 MG PO TABS: 4.0000 mg | ORAL_TABLET | ORAL | Status: DC | PRN

## 2015-05-08 MED ORDER — OMEPRAZOLE MAGNESIUM 20 MG PO TBEC: 20.0000 mg | DELAYED_RELEASE_TABLET | Freq: Every day | ORAL | Status: DC

## 2015-05-08 MED ORDER — DEXTROSE 5 % IV SOLN
INTRAVENOUS | Status: DC | PRN
  Administered 2015-05-08: 09:00:00 via INTRAVENOUS

## 2015-05-08 MED ORDER — HYDROMORPHONE HCL 1 MG/ML IJ SOLN
INTRAMUSCULAR | Status: AC
  Administered 2015-05-08: 0.5 mg via INTRAVENOUS
  Filled 2015-05-08: qty 1

## 2015-05-08 MED ORDER — HEMOSTATIC AGENTS (NO CHARGE) OPTIME
TOPICAL | Status: DC | PRN
  Administered 2015-05-08: 1 via TOPICAL

## 2015-05-08 MED ORDER — LIDOCAINE HCL (CARDIAC) 20 MG/ML IV SOLN
INTRAVENOUS | Status: DC | PRN
  Administered 2015-05-08: 60 mg via INTRAVENOUS
  Administered 2015-05-08: 40 mg via INTRAVENOUS

## 2015-05-08 MED ORDER — DEXAMETHASONE SODIUM PHOSPHATE 4 MG/ML IJ SOLN
INTRAMUSCULAR | Status: AC
  Filled 2015-05-08: qty 2

## 2015-05-08 MED ORDER — ONDANSETRON HCL 4 MG/2ML IJ SOLN: 4.0000 mg | Freq: Four times a day (QID) | INTRAMUSCULAR | Status: DC | PRN

## 2015-05-08 MED ORDER — DEXAMETHASONE SODIUM PHOSPHATE 4 MG/ML IJ SOLN
INTRAMUSCULAR | Status: DC | PRN
Start: 2015-05-08 — End: 2015-05-08
  Administered 2015-05-08: 8 mg via INTRAVENOUS

## 2015-05-08 MED ORDER — HYDROCODONE-ACETAMINOPHEN 5-325 MG PO TABS
1.0000 | ORAL_TABLET | ORAL | Status: DC | PRN
  Administered 2015-05-08 – 2015-05-09 (×2): 2 via ORAL
  Filled 2015-05-08 (×2): qty 2

## 2015-05-08 MED ORDER — LACTATED RINGERS IV SOLN
INTRAVENOUS | Status: DC | PRN
  Administered 2015-05-08 (×3): via INTRAVENOUS

## 2015-05-08 MED ORDER — SUCCINYLCHOLINE CHLORIDE 20 MG/ML IJ SOLN
INTRAMUSCULAR | Status: DC | PRN
  Administered 2015-05-08: 120 mg via INTRAVENOUS

## 2015-05-08 MED ORDER — MORPHINE SULFATE 2 MG/ML IJ SOLN
2.0000 mg | INTRAMUSCULAR | Status: DC | PRN
  Administered 2015-05-08 (×2): 2 mg via INTRAVENOUS
  Filled 2015-05-08: qty 1

## 2015-05-08 MED ORDER — FENTANYL CITRATE (PF) 250 MCG/5ML IJ SOLN
INTRAMUSCULAR | Status: AC
  Filled 2015-05-08: qty 5

## 2015-05-08 MED ORDER — MIDAZOLAM HCL 5 MG/5ML IJ SOLN
INTRAMUSCULAR | Status: DC | PRN
  Administered 2015-05-08 (×2): 1 mg via INTRAVENOUS

## 2015-05-08 MED ORDER — FLUTICASONE PROPIONATE 50 MCG/ACT NA SUSP
1.0000 | Freq: Every day | NASAL | Status: DC | PRN
  Filled 2015-05-08: qty 16

## 2015-05-08 MED ORDER — 0.9 % SODIUM CHLORIDE (POUR BTL) OPTIME
TOPICAL | Status: DC | PRN
  Administered 2015-05-08: 1000 mL

## 2015-05-08 MED ORDER — HYDROMORPHONE HCL 1 MG/ML IJ SOLN
0.2500 mg | INTRAMUSCULAR | Status: DC | PRN
  Administered 2015-05-08 (×4): 0.5 mg via INTRAVENOUS

## 2015-05-08 MED ORDER — CEFAZOLIN SODIUM-DEXTROSE 2-3 GM-% IV SOLR
INTRAVENOUS | Status: DC | PRN
  Administered 2015-05-08: 2 g via INTRAVENOUS

## 2015-05-08 MED ORDER — SUCCINYLCHOLINE CHLORIDE 20 MG/ML IJ SOLN
INTRAMUSCULAR | Status: AC
  Filled 2015-05-08: qty 1

## 2015-05-08 MED ORDER — KCL IN DEXTROSE-NACL 20-5-0.45 MEQ/L-%-% IV SOLN
INTRAVENOUS | Status: DC
  Administered 2015-05-08: 14:00:00 via INTRAVENOUS
  Filled 2015-05-08 (×5): qty 1000

## 2015-05-08 MED ORDER — LIDOCAINE-EPINEPHRINE 1 %-1:100000 IJ SOLN
INTRAMUSCULAR | Status: AC
  Filled 2015-05-08: qty 1

## 2015-05-08 MED ORDER — LIDOCAINE-EPINEPHRINE 1 %-1:100000 IJ SOLN
INTRAMUSCULAR | Status: DC | PRN
  Administered 2015-05-08: 5 mL

## 2015-05-08 MED ORDER — ONDANSETRON HCL 4 MG/2ML IJ SOLN: 4.0000 mg | INTRAMUSCULAR | Status: DC | PRN

## 2015-05-08 MED ORDER — PROPOFOL 10 MG/ML IV BOLUS
INTRAVENOUS | Status: DC | PRN
  Administered 2015-05-08 (×4): 30 mg via INTRAVENOUS
  Administered 2015-05-08: 200 mg via INTRAVENOUS
  Administered 2015-05-08: 30 mg via INTRAVENOUS

## 2015-05-08 MED ORDER — DIPHENHYDRAMINE HCL 25 MG PO TABS
25.0000 mg | ORAL_TABLET | Freq: Every evening | ORAL | Status: DC | PRN
  Filled 2015-05-08: qty 1

## 2015-05-08 MED ORDER — FENTANYL CITRATE (PF) 100 MCG/2ML IJ SOLN
INTRAMUSCULAR | Status: DC | PRN
  Administered 2015-05-08 (×3): 50 ug via INTRAVENOUS
  Administered 2015-05-08: 100 ug via INTRAVENOUS

## 2015-05-08 MED ORDER — LIDOCAINE HCL (CARDIAC) 20 MG/ML IV SOLN
INTRAVENOUS | Status: AC
  Filled 2015-05-08: qty 10

## 2015-05-08 MED ORDER — PHENYLEPHRINE HCL 10 MG/ML IJ SOLN
10.0000 mg | INTRAVENOUS | Status: DC | PRN
  Administered 2015-05-08: 40 ug/min via INTRAVENOUS

## 2015-05-08 MED ORDER — ONDANSETRON HCL 4 MG/2ML IJ SOLN
INTRAMUSCULAR | Status: AC
Start: 2015-05-08 — End: 2015-05-08
  Filled 2015-05-08: qty 2

## 2015-05-08 MED ORDER — ONDANSETRON HCL 4 MG/2ML IJ SOLN
INTRAMUSCULAR | Status: DC | PRN
  Administered 2015-05-08: 4 mg via INTRAVENOUS

## 2015-05-08 MED ORDER — PROPOFOL INFUSION 10 MG/ML OPTIME
INTRAVENOUS | Status: DC | PRN
  Administered 2015-05-08: 50 ug/kg/min via INTRAVENOUS

## 2015-05-08 MED ORDER — ZOLPIDEM TARTRATE 5 MG PO TABS: 5.0000 mg | ORAL_TABLET | Freq: Every evening | ORAL | Status: DC | PRN

## 2015-05-08 MED ORDER — ARTIFICIAL TEARS OP OINT
TOPICAL_OINTMENT | OPHTHALMIC | Status: AC
  Filled 2015-05-08: qty 3.5

## 2015-05-08 MED ORDER — LIDOCAINE HCL (CARDIAC) 20 MG/ML IV SOLN
INTRAVENOUS | Status: AC
  Filled 2015-05-08: qty 5

## 2015-05-08 MED ORDER — CALCIUM CARBONATE-VITAMIN D 500-200 MG-UNIT PO TABS
2.0000 | ORAL_TABLET | Freq: Two times a day (BID) | ORAL | Status: DC
  Administered 2015-05-08 – 2015-05-09 (×3): 2 via ORAL
  Filled 2015-05-08 (×3): qty 2

## 2015-05-08 MED ORDER — ONDANSETRON HCL 4 MG/2ML IJ SOLN
INTRAMUSCULAR | Status: AC
  Filled 2015-05-08: qty 4

## 2015-05-08 MED ORDER — MORPHINE SULFATE 2 MG/ML IJ SOLN
INTRAMUSCULAR | Status: AC
  Filled 2015-05-08: qty 1

## 2015-05-08 SURGICAL SUPPLY — 58 items
ATTRACTOMAT 16X20 MAGNETIC DRP (DRAPES) ×3 IMPLANT
BENZOIN TINCTURE PRP APPL 2/3 (GAUZE/BANDAGES/DRESSINGS) ×3 IMPLANT
BLADE 10 SAFETY STRL DISP (BLADE) ×3 IMPLANT
BLADE SURG 15 STRL LF DISP TIS (BLADE) IMPLANT
BLADE SURG 15 STRL SS (BLADE)
BLADE SURG CLIPPER 3M 9600 (MISCELLANEOUS) IMPLANT
CANISTER SUCTION 2500CC (MISCELLANEOUS) ×3 IMPLANT
CLEANER TIP ELECTROSURG 2X2 (MISCELLANEOUS) ×3 IMPLANT
CLIP TI WIDE RED SMALL 24 (CLIP) IMPLANT
CONT SPEC 4OZ CLIKSEAL STRL BL (MISCELLANEOUS) IMPLANT
CORDS BIPOLAR (ELECTRODE) ×3 IMPLANT
COVER SURGICAL LIGHT HANDLE (MISCELLANEOUS) ×3 IMPLANT
CRADLE DONUT ADULT HEAD (MISCELLANEOUS) ×3 IMPLANT
DERMABOND ADVANCED (GAUZE/BANDAGES/DRESSINGS) ×2
DERMABOND ADVANCED .7 DNX12 (GAUZE/BANDAGES/DRESSINGS) ×1 IMPLANT
DRAIN CHANNEL 10F 3/8 F FF (DRAIN) ×3 IMPLANT
DRAPE PROXIMA HALF (DRAPES) IMPLANT
ELECT COATED BLADE 2.86 ST (ELECTRODE) ×3 IMPLANT
ELECT REM PT RETURN 9FT ADLT (ELECTROSURGICAL) ×3
ELECTRODE REM PT RTRN 9FT ADLT (ELECTROSURGICAL) ×1 IMPLANT
EVACUATOR SILICONE 100CC (DRAIN) ×3 IMPLANT
FORCEPS BIPOLAR SPETZLER 8 1.0 (NEUROSURGERY SUPPLIES) ×3 IMPLANT
GAUZE SPONGE 4X4 16PLY XRAY LF (GAUZE/BANDAGES/DRESSINGS) ×15 IMPLANT
GLOVE BIO SURGEON STRL SZ 6.5 (GLOVE) ×2 IMPLANT
GLOVE BIO SURGEON STRL SZ7 (GLOVE) ×3 IMPLANT
GLOVE BIO SURGEONS STRL SZ 6.5 (GLOVE) ×1
GLOVE BIOGEL PI IND STRL 6.5 (GLOVE) ×1 IMPLANT
GLOVE BIOGEL PI IND STRL 7.0 (GLOVE) ×1 IMPLANT
GLOVE BIOGEL PI INDICATOR 6.5 (GLOVE) ×2
GLOVE BIOGEL PI INDICATOR 7.0 (GLOVE) ×2
GLOVE ECLIPSE 7.5 STRL STRAW (GLOVE) ×3 IMPLANT
GLOVE SURG SS PI 7.0 STRL IVOR (GLOVE) ×3 IMPLANT
GOWN STRL REUS W/ TWL LRG LVL3 (GOWN DISPOSABLE) ×3 IMPLANT
GOWN STRL REUS W/TWL LRG LVL3 (GOWN DISPOSABLE) ×6
HEMOSTAT SURGICEL 2X14 (HEMOSTASIS) ×3 IMPLANT
KIT BASIN OR (CUSTOM PROCEDURE TRAY) ×3 IMPLANT
KIT ROOM TURNOVER OR (KITS) ×3 IMPLANT
LIQUID BAND (GAUZE/BANDAGES/DRESSINGS) ×3 IMPLANT
NEEDLE HYPO 25GX1X1/2 BEV (NEEDLE) ×3 IMPLANT
NS IRRIG 1000ML POUR BTL (IV SOLUTION) ×3 IMPLANT
PAD ARMBOARD 7.5X6 YLW CONV (MISCELLANEOUS) ×3 IMPLANT
PENCIL BUTTON HOLSTER BLD 10FT (ELECTRODE) ×3 IMPLANT
PROBE NERVBE PRASS .33 (MISCELLANEOUS) ×3 IMPLANT
SHEARS HARMONIC 9CM CVD (BLADE) ×3 IMPLANT
SPECIMEN JAR MEDIUM (MISCELLANEOUS) ×3 IMPLANT
SPONGE INTESTINAL PEANUT (DISPOSABLE) ×3 IMPLANT
SUT ETHILON 2 0 FS 18 (SUTURE) ×3 IMPLANT
SUT PROLENE 6 0 CC 1 (SUTURE) IMPLANT
SUT SILK 2 0 FS (SUTURE) ×3 IMPLANT
SUT SILK 3 0 REEL (SUTURE) ×3 IMPLANT
SUT VICRYL 4-0 PS2 18IN ABS (SUTURE) ×3 IMPLANT
TAPE CLOTH 4X10 WHT NS (GAUZE/BANDAGES/DRESSINGS) ×3 IMPLANT
TRAY ENT MC OR (CUSTOM PROCEDURE TRAY) ×3 IMPLANT
TRAY FOLEY CATH 14FRSI W/METER (CATHETERS) IMPLANT
TUBE ENDOTRAC EMG 7X10.2 (MISCELLANEOUS) ×3 IMPLANT
TUBE ENDOTRAC EMG 8X11.3 (MISCELLANEOUS) IMPLANT
TUBE ENDOTRACH  EMG 6MMTUBE EN (MISCELLANEOUS)
TUBE ENDOTRACH EMG 6MMTUBE EN (MISCELLANEOUS) IMPLANT

## 2015-05-08 NOTE — Anesthesia Procedure Notes (Signed)
Procedure Name: Intubation Date/Time: 05/08/2015 8:31 AM Performed by: Terrill Mohr Pre-anesthesia Checklist: Patient identified, Emergency Drugs available, Suction available and Patient being monitored Patient Re-evaluated:Patient Re-evaluated prior to inductionOxygen Delivery Method: Circle system utilized Preoxygenation: Pre-oxygenation with 100% oxygen Intubation Type: IV induction Ventilation: Mask ventilation without difficulty Laryngoscope Size: Mac and 4 Grade View: Grade II Tube type: Oral (NIMS) Tube size: 7.5 mm Number of attempts: 1 Airway Equipment and Method: Stylet Placement Confirmation: ETT inserted through vocal cords under direct vision,  breath sounds checked- equal and bilateral and positive ETCO2 Secured at: 21 (cm at teeth) cm Tube secured with: Tape Dental Injury: Teeth and Oropharynx as per pre-operative assessment

## 2015-05-08 NOTE — Transfer of Care (Signed)
Immediate Anesthesia Transfer of Care Note  Patient: Rebecca Campos  Procedure(s) Performed: Procedure(s): COMPLETION THYROIDECTOMY (N/A)  Patient Location: PACU  Anesthesia Type:General  Level of Consciousness: awake and patient cooperative  Airway & Oxygen Therapy: Patient Spontanous Breathing and Patient connected to nasal cannula oxygen  Post-op Assessment: Report given to RN, Post -op Vital signs reviewed and stable and Patient moving all extremities  Post vital signs: Reviewed and stable  Last Vitals:  Filed Vitals:   05/08/15 1130  BP: 134/73  Pulse:   Temp:   Resp: 20    Complications: No apparent anesthesia complications

## 2015-05-08 NOTE — Anesthesia Preprocedure Evaluation (Addendum)
Anesthesia Evaluation  Patient identified by MRN, date of birth, ID band Patient awake    Reviewed: Allergy & Precautions, NPO status , Patient's Chart, lab work & pertinent test results  Airway Mallampati: II  TM Distance: >3 FB Neck ROM: full    Dental  (+) Teeth Intact, Dental Advisory Given   Pulmonary neg pulmonary ROS,  breath sounds clear to auscultation        Cardiovascular negative cardio ROS  Rhythm:regular Rate:Normal     Neuro/Psych  Headaches,    GI/Hepatic GERD-  Medicated and Controlled,  Endo/Other  Hypothyroidism Morbid obesity  Renal/GU      Musculoskeletal   Abdominal   Peds  Hematology   Anesthesia Other Findings   Reproductive/Obstetrics                            Anesthesia Physical Anesthesia Plan  ASA: II  Anesthesia Plan: General   Post-op Pain Management:    Induction: Intravenous  Airway Management Planned: Oral ETT  Additional Equipment:   Intra-op Plan:   Post-operative Plan: Extubation in OR  Informed Consent: I have reviewed the patients History and Physical, chart, labs and discussed the procedure including the risks, benefits and alternatives for the proposed anesthesia with the patient or authorized representative who has indicated his/her understanding and acceptance.     Plan Discussed with: CRNA, Anesthesiologist and Surgeon  Anesthesia Plan Comments:         Anesthesia Quick Evaluation

## 2015-05-08 NOTE — H&P (Signed)
Cc: Thyroid mass  HPI: The patient is a 39 y/o female who presents today for evaluation of a thyroid goiter. According to the patient, she underwent a right hemithyroidectomy surgery in 2013 by Dr. Ernesto Rutherford to remove a 6 cm nodule. The pathology was consistent with a benign goiter. Since that time, the patient has noted persistent hoarseness and neck swelling. Recent ultrasound showed a 2.5 cm nodule at the isthmus. Fine needle biopsy was performed and again came back benign.  The patient presents today with continued complaints of neck swelling, dysphagia, hoarseness, and neck pain. She is very concerned that her symptoms have persisted despite surgery. The patient has no history of other ENT procedures.   The patient's review of systems (constitutional, eyes, ENT, cardiovascular, respiratory, GI, musculoskeletal, skin, neurologic, psychiatric, endocrine, hematologic, allergic) is noted in the ROS questionnaire.  It is reviewed with the patient.   Allergies: Darvocet  Family health history: Diabetes.   Major events: Tonsillectomy, breast reduction, right thyroidectomy, myomectomy.   Ongoing medical problems: Thyroid disease, reflux, headache, migraine.   Social history: The patient is single. She denies the use of alcohol , tobacco and illegal drugs.  Exam General: Communicates without difficulty, well nourished, no acute distress. Voice is hoarse. Head: Normocephalic, no evidence injury, no tenderness, facial buttresses intact without stepoff. Eyes: PERRL, EOMI. No scleral icterus, conjunctivae clear. Neuro: CN II exam reveals vision grossly intact.  No nystagmus at any point of gaze. Ears: Auricles well formed without lesions.  Ear canals are intact without mass or lesion.  No erythema or edema is appreciated.  The TMs are intact without fluid. Nose: External evaluation reveals normal support and skin without lesions.  Dorsum is intact.  Anterior rhinoscopy reveals healthy pink mucosa over  anterior aspect of inferior turbinates and intact septum.  No purulence noted. Oral:  Oral cavity and oropharynx are intact, symmetric, without erythema or edema.  Mucosa is moist without lesions. Neck: Full range of motion without pain.  There is no significant lymphadenopathy.  No masses palpable.  Thyroid bed is enlarged with a large scar below her neckline. Parotid glands and submandibular glands equal bilaterally without mass.  Trachea is midline. Neuro:  CN 2-12 grossly intact. Gait normal. Vestibular: No nystagmus at any point of gaze. The cerebellar examination is unremarkable.   Procedure:  Flexible Fiberoptic Laryngoscopy -- Risks, benefits, and alternatives of flexible endoscopy were explained to the patient.  Specific mention was made of the risk of throat numbness with difficulty swallowing, possible bleeding from the nose and mouth, and pain from the procedure.  The patient gave oral consent to proceed.  The nasal cavities were decongested and anesthetized with a combination of oxymetazoline and 4% lidocaine solution.  The flexible scope was inserted into the right nasal cavity and advanced towards the nasopharynx.  Visualized mucosa over the turbinates and septum were as described above.  The nasopharynx was clear.  Oropharyngeal walls were symmetric and mobile without lesion, mass, or edema.  Hypopharynx was also without  lesion or edema.  Larynx was mobile without lesions. Supraglottic structures were free of edema, mass, and asymmetry.  True vocal folds were white without mass or lesion.  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 Base of tongue was within normal limits.  The patient tolerated the procedure well.   Assessment 1.  The patient is s/p right hemithyroidectomy with residual large isthmus nodule that continues to cause compressive symptoms. FNA of the nodule showed no obvious malignancy. 2.  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 No  other suspicious mass or lesion is noted.   Plan 1.  The physical exam and laryngoscopy findings are reviewed with the patient at length. All questions and concerns are discussed. 2.  In light of the patient's persistent compressive symptoms, she will benefit from having her residual thyroid removed. The risks, benefits, alternatives, and details of the procedure are reviewed with the patient.  3.  The patient would like to proceed with surgery.

## 2015-05-08 NOTE — Progress Notes (Signed)
Patient stated IV site was hurting no blood return was noted. IV D/C'd patient upset at idea of having to restart another. Diet has been advanced to soft. Patient tolerating fluids. Will continue to monitor. Jimmie Molly, RN

## 2015-05-08 NOTE — Brief Op Note (Signed)
05/08/2015  11:07 AM  PATIENT:  Rebecca Campos  39 y.o. female  PRE-OPERATIVE DIAGNOSIS:  Thyroid mass  POST-OPERATIVE DIAGNOSIS:  Thyroid mass  PROCEDURE:  Procedure(s): COMPLETION THYROIDECTOMY (N/A)  SURGEON:  Surgeon(s) and Role:    * Leta Baptist, MD - Primary  PHYSICIAN ASSISTANT: Rometta Emery, PA-C  ANESTHESIA:   general  EBL:  Total I/O In: 2050 [I.V.:2050] Out: 75 [Blood:75]  BLOOD ADMINISTERED:none  DRAINS: (#10) Jackson-Pratt drain(s) with closed bulb suction in the neck   LOCAL MEDICATIONS USED:  LIDOCAINE   SPECIMEN:  No Specimen and Source of Specimen:  Left thyroid lobe and isthmus  DISPOSITION OF SPECIMEN:  PATHOLOGY  COUNTS:  YES  TOURNIQUET:  * No tourniquets in log *  DICTATION: .Other Dictation: Dictation Number [id number]  PLAN OF CARE: Admit for overnight observation  PATIENT DISPOSITION:  PACU - hemodynamically stable.   Delay start of Pharmacological VTE agent (>24hrs) due to surgical blood loss or risk of bleeding: not applicable

## 2015-05-08 NOTE — Discharge Instructions (Signed)
Thyroidectomy Care After Refer to this sheet in the next few weeks. These instructions provide you with general information on caring for yourself after you leave the hospital. Your caregiver also may give you specific instructions. Your treatment has been planned according to the most current medical practices available, but problems sometimes occur. Call your caregiver if you have any problems or questions after your procedure. HOME CARE INSTRUCTIONS   It is normal to be sore for a few weeks following surgery. See your caregiver if your pain seems to be getting worse rather than better.  You may resume a normal diet and activities as directed by your caregiver.  Avoid lifting weight greater than 20 lb (9 kg) or participating in heavy exercise or contact sports for 10 days or as instructed by your caregiver.  Make an appointment to see your caregiver for postop followup. SEEK MEDICAL CARE IF:   You have increased bleeding from your wound.  You have redness, swelling, or increasing pain from your wound or in your neck.  There is pus coming from your wound.  You have an oral temperature above 102 F (38.9 C).  There is a bad smell coming from the wound or dressing.  You develop lightheadedness or feel faint.  You develop numbness, tingling, or muscle spasms in your arms, hands, feet, or face.  You have difficulty swallowing. SEEK IMMEDIATE MEDICAL CARE IF:   You develop a rash.  You have difficulty breathing.  You hear whistling noises that come from your chest.  You develop a cough that becomes increasingly worse.  You develop any reaction or side effects to medicines given.  There is swelling in your neck.  You develop changes in speech or hoarseness, which is getting worse. MAKE SURE YOU:   Understand these instructions.  Will watch your condition.  Will get help right away if you are not doing well or get worse. Document Released: 06/26/2005 Document Revised:  02/29/2012 Document Reviewed: 02/13/2011 [facility] Patient Information 2015 [place], [place]. This information is not intended to replace advice given to you by your health care provider. Make sure you discuss any questions you have with your health care provider.

## 2015-05-09 ENCOUNTER — Encounter (HOSPITAL_COMMUNITY): Payer: Self-pay | Admitting: Otolaryngology

## 2015-05-09 DIAGNOSIS — E042 Nontoxic multinodular goiter: Secondary | ICD-10-CM | POA: Diagnosis not present

## 2015-05-09 LAB — CALCIUM
CALCIUM: 9.6 mg/dL (ref 8.9–10.3)
Calcium: 9.8 mg/dL (ref 8.9–10.3)

## 2015-05-09 MED ORDER — HYDROCODONE-ACETAMINOPHEN 5-325 MG PO TABS: 1.0000 | ORAL_TABLET | ORAL | Status: DC | PRN

## 2015-05-09 MED ORDER — AMOXICILLIN 875 MG PO TABS: 875.0000 mg | ORAL_TABLET | Freq: Two times a day (BID) | ORAL | Status: DC

## 2015-05-09 MED ORDER — CALCIUM CARBONATE-VITAMIN D 500-200 MG-UNIT PO TABS: 2.0000 | ORAL_TABLET | Freq: Every day | ORAL | Status: DC

## 2015-05-09 NOTE — Plan of Care (Signed)
Problem: Phase I Progression Outcomes Goal: Sutures/staples intact Outcome: Completed/Met Date Met:  05/09/15 Skin glue  Problem: Phase II Progression Outcomes Goal: Dressings dry/intact Outcome: Not Applicable Date Met:  05/09/15 Open to air Goal: Sutures/staples intact Outcome: Completed/Met Date Met:  05/09/15 Skin glue     

## 2015-05-09 NOTE — Progress Notes (Signed)
Discharge home. Home discharge instruction given, no question verbalized. 

## 2015-05-09 NOTE — Discharge Summary (Signed)
Physician Discharge Summary  Patient ID: Rebecca Campos MRN: [id number] DOB/AGE: [birth year] 39 y.o.  Admit date: 05/08/2015 Discharge date: 05/09/2015  Admission Diagnoses: Thyroid goiter  Discharge Diagnoses: Thyroid goiter Active Problems:   S/P complete thyroidectomy   Discharged Condition: good  Hospital Course: Pt had an uneventful overnight stay. Pt tolerated po well. No bleeding. No stridor. Voice is strong. Calcium level is stable.  Consults: None  Significant Diagnostic Studies: none  Treatments: surgery: Completion thyroidectomy  Discharge Exam: Blood pressure 114/67, pulse 94, temperature 98 F (36.7 C), temperature source Oral, resp. rate 16, height 5\' 1"  (1.549 m), weight 104.327 kg (230 lb), last menstrual period 04/24/2015, SpO2 99 %. Incision/Wound:c/d/i  Disposition: 01-Home or Self Care  Discharge Instructions    Activity as tolerated - No restrictions    Complete by:  As directed      Diet general    Complete by:  As directed             Medication List    STOP taking these medications        aspirin-acetaminophen-caffeine 250-250-65 MG per tablet  Commonly known as:  EXCEDRIN MIGRAINE     cefUROXime 250 MG tablet  Commonly known as:  CEFTIN     naproxen 500 MG tablet  Commonly known as:  NAPROSYN      TAKE these medications        acetaminophen 650 MG CR tablet  Commonly known as:  TYLENOL  Take 650 mg by mouth every 8 (eight) hours as needed for pain (menstrual cramps/headaches).     acetaminophen 500 MG tablet  Commonly known as:  TYLENOL  Take 1,000 mg by mouth every 6 (six) hours as needed for headache (menstrual cramps).     amoxicillin 875 MG tablet  Commonly known as:  AMOXIL  Take 1 tablet (875 mg total) by mouth 2 (two) times daily.     calcium-vitamin D 500-200 MG-UNIT per tablet  Commonly known as:  OSCAL WITH D  Take 2 tablets by mouth daily with breakfast.     cetirizine 10 MG tablet  Commonly known as:   ZYRTEC  Take 10 mg by mouth daily as needed for allergies.     diphenhydrAMINE 25 MG tablet  Commonly known as:  BENADRYL  Take 25 mg by mouth at bedtime as needed for allergies.     fluticasone 50 MCG/ACT nasal spray  Commonly known as:  FLONASE  Place 1 spray into both nostrils daily as needed for allergies.     HYDROcodone-acetaminophen 5-325 MG per tablet  Commonly known as:  NORCO/VICODIN  Take 1-2 tablets by mouth every 4 (four) hours as needed for moderate pain or severe pain.     ibuprofen 200 MG tablet  Commonly known as:  ADVIL,MOTRIN  Take 600 mg by mouth daily as needed (menstrual cramps).     levothyroxine 150 MCG tablet  Commonly known as:  SYNTHROID, LEVOTHROID  Take 150 mcg by mouth daily before breakfast.     Melatonin 10 MG Tabs  Take 10 mg by mouth at bedtime as needed (sleep).     omeprazole 20 MG tablet  Commonly known as:  PRILOSEC OTC  Take 20 mg by mouth daily.     OVER THE COUNTER MEDICATION  Take 2 tablets by mouth at bedtime. [place] Prenatal Mutlivitamin Adult Gummy with DHA & Folic Acid     traMADol 50 MG tablet  Commonly known as:  ULTRAM  Take 1 tablet (  50 mg total) by mouth every 6 (six) hours.     valACYclovir 1000 MG tablet  Commonly known as:  VALTREX  Take 2,000 mg by mouth 2 (two) times daily as needed (fever blisters).           Follow-up Information    Follow up with Ascencion Dike, MD On 05/15/2015.   Specialty:  Otolaryngology   Why:  at 1:20pm   Contact information:   [address] [place] [address] [phone]       Signed: Ascencion Dike 05/09/2015, 8:09 AM

## 2015-05-09 NOTE — Op Note (Signed)
Rebecca Campos, Rebecca Campos            ACCOUNT NO.:  [id number]  MEDICAL RECORD NO.:  [id number]  LOCATION:  6N31C                        FACILITY:  [facility]  PHYSICIAN:  Leta Baptist, MD            DATE OF BIRTH:  04-May-1976  DATE OF PROCEDURE:  05/08/2015 DATE OF DISCHARGE:                              OPERATIVE REPORT   SURGEON:  Leta Baptist, MD  ASSISTANT:  Rometta Emery, PA  PREOPERATIVE DIAGNOSIS:  Multinodular thyroid goiter.  POSTOPERATIVE DIAGNOSIS:  Multinodular thyroid goiter.  PROCEDURE PERFORMED:  Completion thyroidectomy.  ANESTHESIA:  General endotracheal tube anesthesia.  COMPLICATIONS:  None.  ESTIMATED BLOOD LOSS:  Less than 100 mL.  INDICATION FOR PROCEDURE:  The patient is a 39 year old female with a history of multinodular thyroid goiter.  She previously underwent a right hemithyroidectomy surgery in 2003, by Dr. Ernesto Rutherford.  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  Since the surgery, the patient continued to have a large 2.5 cm nodule within the isthmus.  She also has smaller nodules on the left side.  The patient continues to have significant compressive symptoms with neck pain and dysphagia.  Based on the above findings, the decision was made for patient to undergo the completion thyroidectomy surgery.  The risks, benefits, alternatives and details of the procedure were discussed with the patient.  Questions were invited and answered.  Informed consent was obtained.  DESCRIPTION OF PROCEDURE:  The patient was taken to the operating room and placed supine on the operating table.  General endotracheal tube anesthesia was administered by the anesthesiologist.  The patient was positioned and prepped and draped in a standard fashion for thyroidectomy surgery.  Recurrent laryngeal nerve monitoring endotracheal tube was placed.  The monitoring system was functional throughout the case.  1% lidocaine with 1:100,000 epinephrine was  injected at the planned site of incision.  An incision was made via the previous lower neck surgical scar.  The incision was carried down past the platysma muscles. Superior and inferiorly based subplatysmal flaps were elevated in a standard fashion.  The strap muscles were divided at midline and retracted laterally, exposing the large thyroid isthmus and left thyroid lobe.  Careful dissection was performed to free the thyroid gland from the surrounding soft tissue.  The left recurrent laryngeal nerve was identified and preserved.  The nerve was noted to be functional throughout the case.  The entire left thyroid lobe and thyroid isthmus were then resected free and sent to the Pathology Department for permanent histologic identification.  Hemostasis was achieved with a combination of Harmonic Scalpel, bipolar electrocautery, and sutured ligations.  The surgical site was copiously irrigated.  A #10 JP drain was placed.  The incision was closed in layers with 4-0 Vicryl and Dermabond.  The care of the patient was turned over to anesthesiologist.  The patient was awakened from anesthesia without difficulty.  She was extubated and transferred to the recovery room in good condition.  OPERATIVE FINDINGS:  A large 2.5 cm nodule was noted within the thyroid isthmus.  Smaller nodules were also noted within the left thyroid lobe.  SPECIMEN:  Completion thyroidectomy specimen.  FOLLOWUP  CARE:  The patient will be observed overnight.  She will be discharged home once her calcium level was stable.  The patient will follow up in my office in 1 week.     Leta Baptist, MD     ST/MEDQ  D:  05/08/2015  T:  05/09/2015  Job:  [id number]

## 2015-05-14 NOTE — Anesthesia Postprocedure Evaluation (Signed)
Anesthesia Post Note  Patient: Rebecca Campos  Procedure(s) Performed: Procedure(s) (LRB): COMPLETION THYROIDECTOMY (N/A)  Anesthesia type: General  Patient location: PACU  Post pain: Pain level controlled and Adequate analgesia  Post assessment: Post-op Vital signs reviewed, Patient's Cardiovascular Status Stable, Respiratory Function Stable, Patent Airway and Pain level controlled  Last Vitals:  Filed Vitals:   05/09/15 1329  BP: 114/72  Pulse: 95  Temp: 36.9 C  Resp: 17    Post vital signs: Reviewed and stable  Level of consciousness: awake, alert  and oriented  Complications: No apparent anesthesia complications

## 2018-07-20 ENCOUNTER — Other Ambulatory Visit: Payer: Self-pay | Admitting: Obstetrics and Gynecology

## 2018-07-20 DIAGNOSIS — D250 Submucous leiomyoma of uterus: Secondary | ICD-10-CM

## 2018-07-28 ENCOUNTER — Other Ambulatory Visit: Payer: Self-pay | Admitting: Obstetrics and Gynecology

## 2018-07-28 ENCOUNTER — Ambulatory Visit
Admission: RE | Admit: 2018-07-28 | Discharge: 2018-07-28 | Disposition: A | Discharge status: Home / Self Care | Payer: No Typology Code available for payment source | Source: Ambulatory Visit | Attending: Obstetrics and Gynecology | Admitting: Obstetrics and Gynecology

## 2018-07-28 DIAGNOSIS — D250 Submucous leiomyoma of uterus: Secondary | ICD-10-CM

## 2018-07-28 HISTORY — DX: Benign neoplasm of connective and other soft tissue, unspecified: D21.9

## 2018-07-28 HISTORY — PX: IR RADIOLOGIST EVAL & MGMT: IMG5224

## 2018-07-28 NOTE — Consult Note (Signed)
Chief Complaint: Patient was seen in consultation today for  Chief Complaint  Patient presents with  . Consult    Consult for [place]     at the request of Cousins,Sheronette  Referring Physician(s): Cousins,Sheronette  History of Present Illness: Rebecca Campos is a 42 y.o. female who currently presents at the request of Dr. Servando Salina for evaluation of possible uterine artery embolization for symptomatic uterine fibroids.  Rebecca Campos has a long history of uterine fibroids and has in fact undergone prior myomectomy approximately 5 or 6 years ago.  She experienced initial relief but now has recurrent symptoms.  Her periods are currently regular occurring every 28 days and last for approximately 5 days.  The first 2 days are heavy with passage of clots.  She requires use of both super absorbent tampons and pads and has to change these every 2-3 hours.  While the menorrhagia is troublesome, her's most significant complaint is severe dysmenorrhea.  Her perimenopausal cramping is much more significant than what her peers experience.  Additionally, she has symptoms of lower abdominal and pelvic pain and pressure consistent with bulk symptoms.  She also admits dyspareunia.  Her Pap smear is up-to-date and is normal.  She has an endometrial biopsy scheduled for later this month.  She is a G0, P0 but has no plans for future pregnancy.  She denies chest pain, shortness of breath, lower extremity swelling.  She has undergone prior total thyroidectomy for benign thyroid disease.  During the first surgery, she experienced paralysis of the right vocal cord.  She is now doing fairly well following speech therapy.  However, she is concerned about undergoing general anesthesia and intubation as any injury to the left vocal cord could result in significant decreased ability to talk and possible need for tracheostomy.  Past Medical History:  Diagnosis Date  . Dysrhythmia    svt post op thyroid   2013  . Fibroids   . GERD (gastroesophageal reflux disease)   . Goiter   . Headache(784.0)   . History of kidney stones   . Hypothyroidism     Past Surgical History:  Procedure Laterality Date  . BREAST REDUCTION SURGERY  95  . BREAST SURGERY    . CYSTOSCOPY W/ STONE MANIPULATION    . IR RADIOLOGIST EVAL & MGMT  07/28/2018  . MYOMECTOMY  06  . THYROIDECTOMY  09/14/2012   Procedure: THYROIDECTOMY;  Surgeon: Thornell Sartorius, MD;  Location: [facility];  Service: ENT;  Laterality: Right;  Right subtotal thyroidectomy   . THYROIDECTOMY  05/08/2015  . THYROIDECTOMY N/A 05/08/2015   Procedure: COMPLETION THYROIDECTOMY;  Surgeon: Leta Baptist, MD;  Location: [facility] OR;  Service: ENT;  Laterality: N/A;  . TONSILLECTOMY  92    Allergies: Percocet [oxycodone-acetaminophen] and Propoxyphene  Medications: Prior to Admission medications   Medication Sig Start Date End Date Taking? Authorizing Provider  acetaminophen (TYLENOL) 500 MG tablet Take 1,000 mg by mouth every 6 (six) hours as needed for headache (menstrual cramps).   Yes [provider]  cetirizine (ZYRTEC) 10 MG tablet Take 10 mg by mouth daily as needed for allergies.   Yes [provider]  diphenhydrAMINE (BENADRYL) 25 MG tablet Take 25 mg by mouth at bedtime as needed for allergies.   Yes [provider]  fluticasone (FLONASE) 50 MCG/ACT nasal spray Place 1 spray into both nostrils daily as needed for allergies.  01/21/15  Yes [provider]  ibuprofen (ADVIL,MOTRIN)  200 MG tablet Take 600 mg by mouth daily as needed (menstrual cramps).   Yes [provider]  levothyroxine (SYNTHROID, LEVOTHROID) 137 MCG tablet Take 137 mcg by mouth daily before breakfast.   Yes [provider]  Melatonin 10 MG TABS Take 10 mg by mouth at bedtime as needed (sleep).    Yes [provider]  omeprazole (PRILOSEC OTC) 20 MG tablet Take 20 mg by mouth daily.   Yes [provider]  OVER THE COUNTER  MEDICATION Take 2 tablets by mouth at bedtime. [place] Prenatal Mutlivitamin Adult Gummy with DHA & Folic Acid   Yes [provider]  phentermine 37.5 MG capsule Take 37.5 mg by mouth every morning.   Yes [provider]  valACYclovir (VALTREX) 1000 MG tablet Take 2,000 mg by mouth 2 (two) times daily as needed (fever blisters).   Yes [provider]     No family history on file.  Social History   Socioeconomic History  . Marital status: Single    Spouse name: Not on file  . Number of children: Not on file  . Years of education: Not on file  . Highest education level: Not on file  Occupational History  . Not on file  Social Needs  . Financial resource strain: Not on file  . Food insecurity:    Worry: Not on file    Inability: Not on file  . Transportation needs:    Medical: Not on file    Non-medical: Not on file  Tobacco Use  . Smoking status: Never Smoker  . Smokeless tobacco: Never Used  Substance and Sexual Activity  . Alcohol use: No  . Drug use: No  . Sexual activity: Not on file  Lifestyle  . Physical activity:    Days per week: Not on file    Minutes per session: Not on file  . Stress: Not on file  Relationships  . Social connections:    Talks on phone: Not on file    Gets together: Not on file    Attends religious service: Not on file    Active member of club or organization: Not on file    Attends meetings of clubs or organizations: Not on file    Relationship status: Not on file  Other Topics Concern  . Not on file  Social History Narrative  . Not on file     Review of Systems: A 12 point ROS discussed and pertinent positives are indicated in the HPI above.  All other systems are negative.  Review of Systems  Vital Signs: BP 139/88   Pulse 84   Temp 98.1 F (36.7 C) (Oral)   Resp 14   Ht 5\' 1"  (1.549 m)   Wt 93.9 kg   LMP 07/22/2018 (Exact Date)   SpO2 100%   BMI 39.11 kg/m   Physical Exam    Constitutional: She is oriented to person, place, and time. She appears well-developed and well-nourished. No distress.  HENT:  Head: Normocephalic and atraumatic.  Eyes: No scleral icterus.  Cardiovascular: Normal rate and regular rhythm.  Pulses:      Radial pulses are 2+ on the right side, and 2+ on the left side.  Pulmonary/Chest: Effort normal.  Abdominal: Soft. She exhibits mass. She exhibits no distension. There is tenderness.  Neurological: She is alert and oriented to person, place, and time.  Skin: Skin is warm and dry.  Nursing note and vitals reviewed.    Imaging: Ir  Radiologist Eval & Mgmt  Result Date: 07/28/2018 Please refer to notes tab for details about interventional procedure. (Op Note)   Labs:  CBC: No results for input(s): WBC, HGB, HCT, PLT in the last 8760 hours.  COAGS: No results for input(s): INR, APTT in the last 8760 hours.  BMP: No results for input(s): NA, K, CL, CO2, GLUCOSE, BUN, CALCIUM, CREATININE, GFRNONAA, GFRAA in the last 8760 hours.  Invalid input(s): CMP  LIVER FUNCTION TESTS: No results for input(s): BILITOT, AST, ALT, ALKPHOS, PROT, ALBUMIN in the last 8760 hours.  TUMOR MARKERS: No results for input(s): AFPTM, CEA, CA199, CHROMGRNA in the last 8760 hours.  Assessment and Plan:  Rebecca Campos is a 43 year old female with symptomatic uterine fibroids.  Her primary symptoms are dysmenorrhea with additional menorrhagia and bulk related symptoms.  Reportedly she has several pedunculated fibroids.  Depending on the severity of the pedunculation and the location of the fibroids, she may still be a good candidate for bilateral uterine artery embolization.  We discussed the risks, benefits and alternatives to uterine artery embolization including hysterectomy and myomectomy.  Due to her past history of unilateral vocal cord paralysis she is somewhat reticent to undergo general anesthesia and intubation unless absolutely necessary.  We  will proceed with MRI evaluation of the pelvis to get a thorough understanding of the anatomy and location of her uterine fibroids.  We will then decide her candidacy for possible uterine artery embolization.  1.)  MRI of the pelvis with gadolinium contrast 2.)  Barring any unexpected findings or severe pedunculation of intracavitary fibroids, we will likely proceed with hypogastric nerve block and uterine artery embolization via a radial artery approach.  Thank you for this interesting consult.  I greatly enjoyed meeting Rebecca Campos and look forward to participating in their care.  A copy of this report was sent to the requesting provider on this date.  Electronically Signed: Jacqulynn Cadet 07/28/2018, 1:37 PM   I spent a total of  40 Minutes in face to face in clinical consultation, greater than 50% of which was counseling/coordinating care for symptomatic uterine fibroids.

## 2018-08-06 ENCOUNTER — Ambulatory Visit
Admission: RE | Admit: 2018-08-06 | Discharge: 2018-08-06 | Disposition: A | Discharge status: Home / Self Care | Payer: No Typology Code available for payment source | Source: Ambulatory Visit | Attending: Obstetrics and Gynecology | Admitting: Obstetrics and Gynecology

## 2018-08-06 DIAGNOSIS — D250 Submucous leiomyoma of uterus: Secondary | ICD-10-CM

## 2018-08-06 MED ORDER — GADOBENATE DIMEGLUMINE 529 MG/ML IV SOLN
19.0000 mL | Freq: Once | INTRAVENOUS | Status: AC | PRN
  Administered 2018-08-06: 19 mL via INTRAVENOUS

## 2018-09-09 ENCOUNTER — Other Ambulatory Visit (HOSPITAL_COMMUNITY): Payer: Self-pay | Admitting: Interventional Radiology

## 2018-09-09 DIAGNOSIS — D259 Leiomyoma of uterus, unspecified: Secondary | ICD-10-CM

## 2018-10-12 ENCOUNTER — Other Ambulatory Visit: Payer: Self-pay | Admitting: Student

## 2018-10-13 ENCOUNTER — Ambulatory Visit (HOSPITAL_COMMUNITY)
Admission: RE | Admit: 2018-10-13 | Discharge: 2018-10-13 | Disposition: A | Discharge status: Home / Self Care | Payer: No Typology Code available for payment source | Source: Ambulatory Visit | Attending: Interventional Radiology | Admitting: Interventional Radiology

## 2018-10-13 ENCOUNTER — Observation Stay (HOSPITAL_COMMUNITY)
Admission: RE | Admit: 2018-10-13 | Discharge: 2018-10-14 | Disposition: A | Discharge status: Home / Self Care | Payer: No Typology Code available for payment source | Source: Ambulatory Visit | Attending: Interventional Radiology | Admitting: Interventional Radiology

## 2018-10-13 ENCOUNTER — Encounter (HOSPITAL_COMMUNITY): Payer: Self-pay

## 2018-10-13 ENCOUNTER — Other Ambulatory Visit: Payer: Self-pay

## 2018-10-13 DIAGNOSIS — N946 Dysmenorrhea, unspecified: Secondary | ICD-10-CM | POA: Diagnosis not present

## 2018-10-13 DIAGNOSIS — D259 Leiomyoma of uterus, unspecified: Secondary | ICD-10-CM | POA: Diagnosis not present

## 2018-10-13 DIAGNOSIS — K219 Gastro-esophageal reflux disease without esophagitis: Secondary | ICD-10-CM | POA: Diagnosis not present

## 2018-10-13 DIAGNOSIS — Z885 Allergy status to narcotic agent status: Secondary | ICD-10-CM | POA: Insufficient documentation

## 2018-10-13 DIAGNOSIS — E039 Hypothyroidism, unspecified: Secondary | ICD-10-CM | POA: Insufficient documentation

## 2018-10-13 DIAGNOSIS — N920 Excessive and frequent menstruation with regular cycle: Secondary | ICD-10-CM | POA: Insufficient documentation

## 2018-10-13 DIAGNOSIS — Z7951 Long term (current) use of inhaled steroids: Secondary | ICD-10-CM | POA: Diagnosis not present

## 2018-10-13 HISTORY — PX: IR ANGIOGRAM PELVIS SELECTIVE OR SUPRASELECTIVE: IMG661

## 2018-10-13 HISTORY — PX: IR EMBO TUMOR ORGAN ISCHEMIA INFARCT INC GUIDE ROADMAPPING: IMG5449

## 2018-10-13 HISTORY — PX: IR ANGIOGRAM SELECTIVE EACH ADDITIONAL VESSEL: IMG667

## 2018-10-13 HISTORY — PX: IR FLUORO GUIDED NEEDLE PLC ASPIRATION/INJECTION LOC: IMG2395

## 2018-10-13 HISTORY — PX: IR US GUIDE VASC ACCESS LEFT: IMG2389

## 2018-10-13 LAB — BASIC METABOLIC PANEL
Anion gap: 8 (ref 5–15)
BUN: 12 mg/dL (ref 6–20)
CALCIUM: 9.6 mg/dL (ref 8.9–10.3)
CO2: 25 mmol/L (ref 22–32)
Chloride: 108 mmol/L (ref 98–111)
Creatinine, Ser: 0.69 mg/dL (ref 0.44–1.00)
Glucose, Bld: 97 mg/dL (ref 70–99)
Potassium: 3.6 mmol/L (ref 3.5–5.1)
Sodium: 141 mmol/L (ref 135–145)

## 2018-10-13 LAB — CBC WITH DIFFERENTIAL/PLATELET
Abs Immature Granulocytes: 0.02 10*3/uL (ref 0.00–0.07)
Basophils Absolute: 0.1 10*3/uL (ref 0.0–0.1)
Basophils Relative: 1 %
EOS ABS: 0.1 10*3/uL (ref 0.0–0.5)
EOS PCT: 1 %
HEMATOCRIT: 38.5 % (ref 36.0–46.0)
HEMOGLOBIN: 11.9 g/dL — AB (ref 12.0–15.0)
IMMATURE GRANULOCYTES: 0 %
Lymphocytes Relative: 48 %
Lymphs Abs: 2.9 10*3/uL (ref 0.7–4.0)
MCH: 27.4 pg (ref 26.0–34.0)
MCHC: 30.9 g/dL (ref 30.0–36.0)
MCV: 88.7 fL (ref 80.0–100.0)
MONOS PCT: 8 %
Monocytes Absolute: 0.5 10*3/uL (ref 0.1–1.0)
NEUTROS PCT: 42 %
Neutro Abs: 2.5 10*3/uL (ref 1.7–7.7)
Platelets: 380 10*3/uL (ref 150–400)
RBC: 4.34 MIL/uL (ref 3.87–5.11)
RDW: 14.2 % (ref 11.5–15.5)
WBC: 5.9 10*3/uL (ref 4.0–10.5)
nRBC: 0 % (ref 0.0–0.2)

## 2018-10-13 LAB — PROTIME-INR
INR: 0.89
PROTHROMBIN TIME: 11.9 s (ref 11.4–15.2)

## 2018-10-13 LAB — APTT: aPTT: 29 seconds (ref 24–36)

## 2018-10-13 LAB — HCG, SERUM, QUALITATIVE: Preg, Serum: NEGATIVE

## 2018-10-13 MED ORDER — LIDOCAINE HCL 1 % IJ SOLN
INTRAMUSCULAR | Status: AC
  Filled 2018-10-13: qty 20

## 2018-10-13 MED ORDER — HYDROMORPHONE HCL 1 MG/ML IJ SOLN
1.0000 mg | Freq: Once | INTRAMUSCULAR | Status: AC
  Administered 2018-10-13: 1 mg via INTRAVENOUS
  Filled 2018-10-13: qty 1

## 2018-10-13 MED ORDER — MIDAZOLAM HCL 2 MG/2ML IJ SOLN
INTRAMUSCULAR | Status: AC
  Filled 2018-10-13: qty 6

## 2018-10-13 MED ORDER — FENTANYL CITRATE (PF) 100 MCG/2ML IJ SOLN
INTRAMUSCULAR | Status: AC | PRN
  Administered 2018-10-13 (×4): 50 ug via INTRAVENOUS

## 2018-10-13 MED ORDER — FENTANYL 12 MCG/HR TD PT72
25.0000 ug | MEDICATED_PATCH | TRANSDERMAL | Status: DC
  Administered 2018-10-13: 25 ug via TRANSDERMAL

## 2018-10-13 MED ORDER — KETOROLAC TROMETHAMINE 30 MG/ML IJ SOLN
30.0000 mg | Freq: Once | INTRAMUSCULAR | Status: AC
  Administered 2018-10-13: 30 mg via INTRAVENOUS
  Filled 2018-10-13: qty 1

## 2018-10-13 MED ORDER — PHENTERMINE HCL 37.5 MG PO CAPS: 37.5000 mg | ORAL_CAPSULE | Freq: Every day | ORAL | Status: DC

## 2018-10-13 MED ORDER — VERAPAMIL HCL 2.5 MG/ML IV SOLN
INTRAVENOUS | Status: AC
  Filled 2018-10-13: qty 2

## 2018-10-13 MED ORDER — VALACYCLOVIR HCL 500 MG PO TABS: 2000.0000 mg | ORAL_TABLET | Freq: Two times a day (BID) | ORAL | Status: DC | PRN

## 2018-10-13 MED ORDER — NITROGLYCERIN 1 MG/10 ML FOR IR/CATH LAB
INTRA_ARTERIAL | Status: AC | PRN
  Administered 2018-10-13: 11:00:00 via INTRA_ARTERIAL

## 2018-10-13 MED ORDER — DEXAMETHASONE SODIUM PHOSPHATE 4 MG/ML IJ SOLN
4.0000 mg | Freq: Once | INTRAMUSCULAR | Status: AC
  Administered 2018-10-13: 4 mg via INTRAVENOUS
  Filled 2018-10-13 (×2): qty 1

## 2018-10-13 MED ORDER — ONDANSETRON HCL 4 MG/2ML IJ SOLN
4.0000 mg | Freq: Four times a day (QID) | INTRAMUSCULAR | Status: DC | PRN
  Administered 2018-10-13 – 2018-10-14 (×4): 4 mg via INTRAVENOUS
  Filled 2018-10-13 (×4): qty 2

## 2018-10-13 MED ORDER — IOPAMIDOL (ISOVUE-300) INJECTION 61%
100.0000 mL | Freq: Once | INTRAVENOUS | Status: AC | PRN
  Administered 2018-10-13: 1 mL via INTRA_ARTERIAL

## 2018-10-13 MED ORDER — HYDROMORPHONE HCL 1 MG/ML IJ SOLN
1.0000 mg | INTRAMUSCULAR | Status: DC | PRN
  Administered 2018-10-13 – 2018-10-14 (×3): 1 mg via INTRAVENOUS
  Filled 2018-10-13 (×3): qty 1

## 2018-10-13 MED ORDER — SODIUM CHLORIDE 0.9% FLUSH
3.0000 mL | Freq: Two times a day (BID) | INTRAVENOUS | Status: DC
  Administered 2018-10-14: 3 mL via INTRAVENOUS

## 2018-10-13 MED ORDER — SODIUM CHLORIDE 0.9 % IV SOLN
INTRAVENOUS | Status: DC
  Administered 2018-10-13 (×2): via INTRAVENOUS

## 2018-10-13 MED ORDER — LIDOCAINE HCL (PF) 1 % IJ SOLN
INTRAMUSCULAR | Status: AC | PRN
  Administered 2018-10-13: 5 mL

## 2018-10-13 MED ORDER — LORATADINE 10 MG PO TABS
10.0000 mg | ORAL_TABLET | Freq: Every day | ORAL | Status: DC
  Administered 2018-10-13: 10 mg via ORAL
  Filled 2018-10-13 (×2): qty 1

## 2018-10-13 MED ORDER — DIPHENHYDRAMINE HCL 25 MG PO TABS: 25.0000 mg | ORAL_TABLET | Freq: Every evening | ORAL | Status: DC | PRN

## 2018-10-13 MED ORDER — NAPROXEN SODIUM 275 MG PO TABS
550.0000 mg | ORAL_TABLET | Freq: Two times a day (BID) | ORAL | Status: DC
  Administered 2018-10-13 – 2018-10-14 (×3): 550 mg via ORAL
  Filled 2018-10-13 (×3): qty 2

## 2018-10-13 MED ORDER — DOCUSATE SODIUM 100 MG PO CAPS
100.0000 mg | ORAL_CAPSULE | Freq: Two times a day (BID) | ORAL | Status: DC
  Administered 2018-10-13: 100 mg via ORAL
  Filled 2018-10-13 (×2): qty 1

## 2018-10-13 MED ORDER — FLUTICASONE PROPIONATE 50 MCG/ACT NA SUSP
1.0000 | Freq: Every day | NASAL | Status: DC | PRN
  Filled 2018-10-13: qty 16

## 2018-10-13 MED ORDER — ROPIVACAINE HCL 5 MG/ML IJ SOLN
INTRAMUSCULAR | Status: AC
  Administered 2018-10-13: 20 mL
  Filled 2018-10-13: qty 30

## 2018-10-13 MED ORDER — DIPHENHYDRAMINE HCL 25 MG PO CAPS
25.0000 mg | ORAL_CAPSULE | Freq: Every evening | ORAL | Status: DC | PRN
  Administered 2018-10-14: 25 mg via ORAL
  Filled 2018-10-13: qty 1

## 2018-10-13 MED ORDER — CEFTRIAXONE SODIUM 1 G IJ SOLR
1.0000 g | Freq: Once | INTRAMUSCULAR | Status: AC
  Administered 2018-10-13: 1 g via INTRAVENOUS
  Filled 2018-10-13: qty 10

## 2018-10-13 MED ORDER — PANTOPRAZOLE SODIUM 40 MG PO TBEC
40.0000 mg | DELAYED_RELEASE_TABLET | Freq: Every day | ORAL | Status: DC
  Filled 2018-10-13 (×2): qty 1

## 2018-10-13 MED ORDER — NITROGLYCERIN IN D5W 100-5 MCG/ML-% IV SOLN
INTRAVENOUS | Status: AC
  Filled 2018-10-13: qty 250

## 2018-10-13 MED ORDER — HEPARIN SODIUM (PORCINE) 1000 UNIT/ML IJ SOLN
INTRAMUSCULAR | Status: AC
  Filled 2018-10-13: qty 1

## 2018-10-13 MED ORDER — FENTANYL CITRATE (PF) 100 MCG/2ML IJ SOLN
INTRAMUSCULAR | Status: AC
  Filled 2018-10-13: qty 4

## 2018-10-13 MED ORDER — PROMETHAZINE HCL 25 MG PO TABS
25.0000 mg | ORAL_TABLET | Freq: Three times a day (TID) | ORAL | Status: DC | PRN
  Administered 2018-10-14: 25 mg via ORAL
  Filled 2018-10-13: qty 1

## 2018-10-13 MED ORDER — SODIUM CHLORIDE 0.9% FLUSH: 3.0000 mL | INTRAVENOUS | Status: DC | PRN

## 2018-10-13 MED ORDER — MELATONIN 5 MG PO TABS
10.0000 mg | ORAL_TABLET | Freq: Every evening | ORAL | Status: DC | PRN
  Filled 2018-10-13: qty 2

## 2018-10-13 MED ORDER — SODIUM CHLORIDE 0.9 % IV SOLN
INTRAVENOUS | Status: AC
  Administered 2018-10-13: 13:00:00 via INTRAVENOUS

## 2018-10-13 MED ORDER — PHENTERMINE HCL 37.5 MG PO TABS: 37.5000 mg | ORAL_TABLET | Freq: Every day | ORAL | Status: DC

## 2018-10-13 MED ORDER — MIDAZOLAM HCL 2 MG/2ML IJ SOLN
INTRAMUSCULAR | Status: AC | PRN
  Administered 2018-10-13 (×6): 1 mg via INTRAVENOUS

## 2018-10-13 MED ORDER — VALACYCLOVIR HCL 500 MG PO TABS
2000.0000 mg | ORAL_TABLET | Freq: Two times a day (BID) | ORAL | Status: DC | PRN
  Filled 2018-10-13: qty 4

## 2018-10-13 MED ORDER — PROMETHAZINE HCL 25 MG RE SUPP: 25.0000 mg | Freq: Three times a day (TID) | RECTAL | Status: DC | PRN

## 2018-10-13 MED ORDER — IOPAMIDOL (ISOVUE-300) INJECTION 61%
100.0000 mL | Freq: Once | INTRAVENOUS | Status: AC | PRN
  Administered 2018-10-13: 60 mL via INTRA_ARTERIAL

## 2018-10-13 MED ORDER — PHENTERMINE HCL 37.5 MG PO CAPS: 37.5000 mg | ORAL_CAPSULE | ORAL | Status: DC

## 2018-10-13 MED ORDER — SODIUM CHLORIDE 0.9 % IV SOLN: 250.0000 mL | INTRAVENOUS | Status: DC | PRN

## 2018-10-13 MED ORDER — OMEPRAZOLE MAGNESIUM 20 MG PO TBEC: 20.0000 mg | DELAYED_RELEASE_TABLET | Freq: Every day | ORAL | Status: DC

## 2018-10-13 MED ORDER — SODIUM CHLORIDE 0.9 % IV SOLN
8.0000 mg | Freq: Once | INTRAVENOUS | Status: AC
  Administered 2018-10-13: 8 mg via INTRAVENOUS
  Filled 2018-10-13: qty 4

## 2018-10-13 MED ORDER — IOPAMIDOL (ISOVUE-300) INJECTION 61%
100.0000 mL | Freq: Once | INTRAVENOUS | Status: AC | PRN
  Administered 2018-10-13: 30 mL via INTRA_ARTERIAL

## 2018-10-13 MED ORDER — VERAPAMIL HCL 2.5 MG/ML IV SOLN
INTRA_ARTERIAL | Status: AC | PRN
  Administered 2018-10-13: 10:00:00 via INTRA_ARTERIAL

## 2018-10-13 MED ORDER — LEVOTHYROXINE SODIUM 137 MCG PO TABS
137.0000 ug | ORAL_TABLET | Freq: Every day | ORAL | Status: DC
  Administered 2018-10-14: 137 ug via ORAL
  Filled 2018-10-13: qty 1

## 2018-10-13 MED ORDER — OXYCODONE-ACETAMINOPHEN 5-325 MG PO TABS
1.0000 | ORAL_TABLET | ORAL | Status: DC
  Administered 2018-10-13 – 2018-10-14 (×5): 1 via ORAL
  Filled 2018-10-13 (×7): qty 1

## 2018-10-13 NOTE — Progress Notes (Signed)
Saw/evaluated patient up on floor with Dr. Laurence Ferrari. Patient underwent an image-guided uterine artery embolization this AM by Dr. Laurence Ferrari.  Patient awake and alert laying in bed. States that her abdominal pain and nausea has improved significantly. Left radial incision c/d/i.  Continue with pain medications as ordered, RN aware. IR to follow.  Bea Graff Dashana Guizar, PA-C 10/13/2018, 5:20 PM

## 2018-10-13 NOTE — H&P (Signed)
Chief Complaint: Patient was seen in consultation today for uterine leiomyoma.  Referring Physician(s): Servando Salina  Supervising Physician: Jacqulynn Cadet  Patient Status: [facility] - Out-pt  History of Present Illness: Rebecca Campos is a 42 y.o. female with a past medical history of headaches, GERD, nephrolithiasis, uterine leiomyomas, and hypothyroidism. She has had a long history of uterine leiomyomas and underwent a myomectomy 5-6 years ago which provided her with some relief, but has since had recurrent symptoms. She has menorrhagia and severe dysmenorrhea. She was referred to Dr. Laurence Ferrari for management at the request of Dr. Garwin Brothers. She consulted with Dr. Laurence Ferrari 07/28/2018 to discuss management options. At that time, patient decided to pursue uterine artery embolization. She had MRI pelvis to evaluate location of her uterine fibroids 08/06/2018, which has been reviewed by Dr. Laurence Ferrari.  Patient presents today for possible image-guided uterine fibroid embolization. Patient awake and alert laying in bed with no complaints at this time. Accompanied by 2 family members at bedside. Denies fever, chills, chest pain, dyspnea, abdominal pain, headache, or dizziness.   Past Medical History:  Diagnosis Date  . Dysrhythmia    svt post op thyroid  2013  . Fibroids   . GERD (gastroesophageal reflux disease)   . Goiter   . Headache(784.0)   . History of kidney stones   . Hypothyroidism     Past Surgical History:  Procedure Laterality Date  . BREAST REDUCTION SURGERY  95  . BREAST SURGERY    . CYSTOSCOPY W/ STONE MANIPULATION    . IR RADIOLOGIST EVAL & MGMT  07/28/2018  . MYOMECTOMY  06  . THYROIDECTOMY  09/14/2012   Procedure: THYROIDECTOMY;  Surgeon: Thornell Sartorius, MD;  Location: [facility];  Service: ENT;  Laterality: Right;  Right subtotal thyroidectomy   . THYROIDECTOMY  05/08/2015  . THYROIDECTOMY N/A 05/08/2015   Procedure: COMPLETION THYROIDECTOMY;  Surgeon: Leta Baptist, MD;  Location: [facility] OR;  Service: ENT;  Laterality: N/A;  . TONSILLECTOMY  92    Allergies: Percocet [oxycodone-acetaminophen] and Propoxyphene  Medications: Prior to Admission medications   Medication Sig Start Date End Date Taking? Authorizing Provider  cetirizine (ZYRTEC) 10 MG tablet Take 10 mg by mouth daily as needed for allergies.   Yes [provider]  ibuprofen (ADVIL,MOTRIN) 200 MG tablet Take 600 mg by mouth daily as needed (menstrual cramps).   Yes [provider]  levothyroxine (SYNTHROID, LEVOTHROID) 137 MCG tablet Take 137 mcg by mouth daily before breakfast.   Yes [provider]  OVER THE COUNTER MEDICATION Take 2 tablets by mouth at bedtime. [place] Prenatal Mutlivitamin Adult Gummy with DHA & Folic Acid   Yes [provider]  acetaminophen (TYLENOL) 500 MG tablet Take 1,000 mg by mouth every 6 (six) hours as needed for headache (menstrual cramps).    [provider]  diphenhydrAMINE (BENADRYL) 25 MG tablet Take 25 mg by mouth at bedtime as needed for allergies.    [provider]  fluticasone (FLONASE) 50 MCG/ACT nasal spray Place 1 spray into both nostrils daily as needed for allergies.  01/21/15   [provider]  Melatonin 10 MG TABS Take 10 mg by mouth at bedtime as needed (sleep).     [provider]  omeprazole (PRILOSEC OTC) 20 MG tablet Take 20 mg by mouth daily.    [provider]  phentermine 37.5 MG capsule Take 37.5 mg by mouth every morning.    [provider]  valACYclovir (VALTREX) 1000  MG tablet Take 2,000 mg by mouth 2 (two) times daily as needed (fever blisters).    [provider]     History reviewed. No pertinent family history.  Social History   Socioeconomic History  . Marital status: Single    Spouse name: Not on file  . Number of children: Not on file  . Years of education: Not on file  . Highest education level: Not on file    Occupational History  . Not on file  Social Needs  . Financial resource strain: Not on file  . Food insecurity:    Worry: Not on file    Inability: Not on file  . Transportation needs:    Medical: Not on file    Non-medical: Not on file  Tobacco Use  . Smoking status: Never Smoker  . Smokeless tobacco: Never Used  Substance and Sexual Activity  . Alcohol use: No  . Drug use: No  . Sexual activity: Not on file  Lifestyle  . Physical activity:    Days per week: Not on file    Minutes per session: Not on file  . Stress: Not on file  Relationships  . Social connections:    Talks on phone: Not on file    Gets together: Not on file    Attends religious service: Not on file    Active member of club or organization: Not on file    Attends meetings of clubs or organizations: Not on file    Relationship status: Not on file  Other Topics Concern  . Not on file  Social History Narrative  . Not on file     Review of Systems: A 12 point ROS discussed and pertinent positives are indicated in the HPI above.  All other systems are negative.  Review of Systems  Constitutional: Negative for chills and fever.  Respiratory: Negative for shortness of breath and wheezing.   Cardiovascular: Negative for chest pain and palpitations.  Gastrointestinal: Negative for abdominal pain.  Neurological: Negative for dizziness and headaches.  Psychiatric/Behavioral: Negative for behavioral problems and confusion.    Vital Signs: BP (!) 149/100 (BP Location: Left Arm)   Pulse (!) 106   Temp 98 F (36.7 C) (Oral)   Resp 18   LMP 10/03/2018   SpO2 100%   Physical Exam  Constitutional: She is oriented to person, place, and time. She appears well-developed and well-nourished. No distress.  Cardiovascular: Normal rate, regular rhythm and normal heart sounds.  No murmur heard. Pulmonary/Chest: Effort normal and breath sounds normal. No respiratory distress. She has no wheezes.  Neurological:  She is alert and oriented to person, place, and time.  Skin: Skin is warm and dry.  Psychiatric: She has a normal mood and affect. Her behavior is normal. Judgment and thought content normal.  Nursing note and vitals reviewed.    MD Evaluation Airway: WNL Heart: WNL Abdomen: WNL Chest/ Lungs: WNL ASA  Classification: 2 Mallampati/Airway Score: Two   Imaging: No results found.  Labs:  CBC: No results for input(s): WBC, HGB, HCT, PLT in the last 8760 hours.  COAGS: No results for input(s): INR, APTT in the last 8760 hours.  BMP: Recent Labs    10/13/18 0755  NA 141  K 3.6  CL 108  CO2 25  GLUCOSE 97  BUN 12  CALCIUM 9.6  CREATININE 0.69  GFRNONAA >60  GFRAA >60    LIVER FUNCTION TESTS: No results for input(s): BILITOT, AST, ALT, ALKPHOS, PROT, ALBUMIN  in the last 8760 hours.  TUMOR MARKERS: No results for input(s): AFPTM, CEA, CA199, CHROMGRNA in the last 8760 hours.  Assessment and Plan:  Uterine leiomyoma. Plan for image-guided uterine artery embolization with hypogastric nerve block today with Dr. Laurence Ferrari. Plan for radial approach- barbeau test preformed on left radial artery which revealed adequate collateral circulation. Patient is NPO. Afebrile and WBCs WNL. She does not take blood thinners. INR 0.89 seconds this AM.  The risks and benefits of embolization were discussed with the patient including, but not limited to bleeding, infection, vascular injury, post operative pain, or contrast induced renal failure. This procedure involves the use of X-rays and because of the nature of the planned procedure, it is possible that we will have prolonged use of X-ray fluoroscopy. Potential radiation risks to you include (but are not limited to) the following: - A slightly elevated risk for cancer several years later in life. This risk is typically less than 0.5% percent. This risk is low in comparison to the normal incidence of human cancer, which is 33% for  women and 50% for men according to the [place]. - Radiation induced injury can include skin redness, resembling a rash, tissue breakdown / ulcers and hair loss (which can be temporary or permanent).  The likelihood of either of these occurring depends on the difficulty of the procedure and whether you are sensitive to radiation due to previous procedures, disease, or genetic conditions.  IF your procedure requires a prolonged use of radiation, you will be notified and given written instructions for further action.  It is your responsibility to monitor the irradiated area for the 2 weeks following the procedure and to notify your physician if you are concerned that you have suffered a radiation induced injury.   All of the patient's questions were answered, patient is agreeable to proceed. Consent signed and in chart.   Thank you for this interesting consult.  I greatly enjoyed meeting Kateryn P Wecker and look forward to participating in their care.  A copy of this report was sent to the requesting provider on this date.  Electronically Signed: Earley Abide, PA-C 10/13/2018, 9:14 AM   I spent a total of 25 Minutes in face to face in clinical consultation, greater than 50% of which was counseling/coordinating care for uterine leiomyoma.

## 2018-10-13 NOTE — Sedation Documentation (Signed)
13cc of air in the TR band

## 2018-10-13 NOTE — Progress Notes (Signed)
Received page from RN stating patient with increased abdominal pain/cramping rated 6/10 at this time. States that she is not scheduled for Percocet until 1700 today. Asks if patient can have anything for breakthrough pain. States her nausea has subsided at this time.  Discussed case with Dr. Laurence Ferrari.  Will order Dilaudid 1 mg IV Q2H PRN severe abdominal pain, for breakthrough pain only per Dr. Laurence Ferrari. RN aware.  Bea Graff Duana Benedict, PA-C 10/13/2018, 3:19 PM

## 2018-10-13 NOTE — Procedures (Signed)
Interventional Radiology Procedure Note  Procedure:  1.) Superior hypogastric nerve block  2.) Left radial approach [place]  Complications: None  Estimated Blood Loss: None  Recommendations: - TR band take-down per protocol - ADAT - Foley out - Pain regimen as ordered  Signed,  Criselda Peaches, MD

## 2018-10-13 NOTE — Progress Notes (Signed)
Saw patient on floor following procedure. Patient underwent an image-guided uterine artery embolization this AM by Dr. Laurence Ferrari.  Patient awake and alert laying in bed. Accompanied by two family members at bedside. Complains of left hip/upper leg pain. States she thinks is related to laying flat so long. Had patient ambulate and pain subsided. Complains of lower abdominal pain. States "I need pain medication but I don't think I can keep anything down".  Spoke with RN. States patient had Zofran approximately 15-20 minutes ago.  Discussed case with Dr. Laurence Ferrari. Patient's moderate sedation from procedure (Versed, Fentanyl) probable cause of nausea.  Will administer Dilaudid 1 mg IV x1 for pain to push through nausea. Return to PO pain medications following this. RN aware.  IR to follow.  Rebecca Graff Louk, PA-C 10/13/2018, 1:19 PM

## 2018-10-14 ENCOUNTER — Other Ambulatory Visit: Payer: Self-pay

## 2018-10-14 DIAGNOSIS — D259 Leiomyoma of uterus, unspecified: Secondary | ICD-10-CM | POA: Diagnosis not present

## 2018-10-14 MED ORDER — HYDROCODONE-ACETAMINOPHEN 5-325 MG PO TABS
1.0000 | ORAL_TABLET | Freq: Every day | ORAL | Status: DC
  Administered 2018-10-14: 1 via ORAL
  Filled 2018-10-14: qty 1

## 2018-10-14 MED ORDER — SODIUM CHLORIDE 0.9 % IV SOLN
INTRAVENOUS | Status: DC
  Administered 2018-10-14: 16:00:00 via INTRAVENOUS

## 2018-10-14 MED ORDER — SODIUM CHLORIDE 0.9 % IV BOLUS
500.0000 mL | Freq: Once | INTRAVENOUS | Status: AC
  Administered 2018-10-14: 500 mL via INTRAVENOUS

## 2018-10-14 NOTE — Discharge Summary (Signed)
Patient ID: Rebecca Campos MRN: [id number] DOB/AGE: Dec 18, 1976 42 y.o.  Admit date: 10/13/2018 Discharge date: 10/14/2018  Supervising Physician: Aletta Edouard  Patient Status: [facility] - In-pt  Admission Diagnoses: Symptomatic uterine fibroids  Discharge Diagnoses:  Active Problems:   Uterine leiomyoma   Uterine fibroid   Discharged Condition: good  Hospital Course:  Rebecca Campos is a 42 y.o. female with a past medical history of headaches, GERD, nephrolithiasis, uterine leiomyomas, and hypothyroidism who underwent uterine artery embolization procedure with Dr. Laurence Ferrari yesterday.  She was admitted overnight for observation.  Initially this AM she did complain of nausea and vomiting.  She does have an intolerance to narcotic pain medication and was transitioned from Percocet to [facility] with adequate resolve of symptoms.  She has been able to tolerance food and drink in small amounts.  She has ambulated.  She continues to void well. She is ready for discharge home today.  She is given prescriptions for Naproxen 550 mg BID for 7 days, Fentanyl patch 25 mcg to be replaced Sunday at 9AM, Norco 5/325 1 tablet q 4 hrs for pain which she is instructed to take for the next 3 days as prescribed, then PRN after that for moderate pain, as well as Zofran 4 mg PO to be taken as prescribed for the next 3 days until nausea well-controlled.  Patient voices understanding of discharge instructions.  Her sister will be available for support at home.  She understands IR schedulers will contact her for follow-up instructions.    Discharge Exam: Blood pressure 134/70, pulse 62, temperature 98.4 F (36.9 C), temperature source Oral, resp. rate 16, last menstrual period 10/03/2018, SpO2 100 %. General appearance: alert, cooperative and no distress Resp: clear to auscultation bilaterally Cardio: regular rate and rhythm, S1, S2 normal, no murmur, click, rub or gallop GI: soft, non-tender; bowel  sounds normal; no masses,  no organomegaly Skin: Skin color, texture, turgor normal. No rashes or lesions Incision/Wound: Radial puncture intact.  Mild bruising, no hematoma.  Non-tender.   Disposition: Discharge disposition: 01-Home or Self Care       Discharge Instructions    Call MD for:  difficulty breathing, headache or visual disturbances   Complete by:  As directed    Call MD for:  persistant dizziness or light-headedness   Complete by:  As directed    Call MD for:  persistant nausea and vomiting   Complete by:  As directed    Call MD for:  severe uncontrolled pain   Complete by:  As directed    Call MD for:  temperature >100.4   Complete by:  As directed    Diet - low sodium heart healthy   Complete by:  As directed    Discharge instructions   Complete by:  As directed    May replace bandaid to forearm if needed to keep clean and dry until scabbed and healed. Take prescriptions as prescribed: Take Naproxen as scheduled for 1 week. Replace Fentanyl patch on Sunday at [place] q 4 hrs for the next 3 days or until pain is controlled.  Once pain controlled, may stop narcotics.  Do not drive while taking narcotics.  May take stool softners as needed to help with constipation.  Zofran 4 mg.  Take scheduled for the next 2-3 days to stay ahead of nausea.   Increase activity slowly   Complete by:  As directed      Allergies as of 10/14/2018  Reactions   Methadone Nausea And Vomiting   Reaction to Darvocet Reaction to Darvocet   Hydrocodone-acetaminophen Itching   Percocet [oxycodone-acetaminophen] Nausea And Vomiting   Propoxyphene Nausea And Vomiting   Reaction to Darvocet      Medication List    STOP taking these medications   acetaminophen 500 MG tablet Commonly known as:  TYLENOL   aspirin-acetaminophen-caffeine 250-250-65 MG tablet Commonly known as:  EXCEDRIN MIGRAINE     TAKE these medications   cetirizine 10 MG tablet Commonly known as:   ZYRTEC Take 10 mg by mouth daily as needed for allergies.   cetirizine-pseudoephedrine 5-120 MG tablet Commonly known as:  ZYRTEC-D Take 1 tablet by mouth daily as needed for allergies.   diphenhydrAMINE 25 MG tablet Commonly known as:  BENADRYL Take 25 mg by mouth at bedtime as needed for allergies.   ibuprofen 200 MG tablet Commonly known as:  ADVIL,MOTRIN Take 600 mg by mouth daily as needed (menstrual cramps).   levothyroxine 137 MCG tablet Commonly known as:  SYNTHROID, LEVOTHROID Take 137 mcg by mouth daily before breakfast.   Melatonin 10 MG Tabs Take 10 mg by mouth at bedtime as needed (sleep).   omeprazole 20 MG tablet Commonly known as:  PRILOSEC OTC Take 20 mg by mouth daily.   OVER THE COUNTER MEDICATION Take 2 tablets by mouth at bedtime. [place] Prenatal Mutlivitamin Adult Gummy with DHA & Folic Acid   phentermine 37.5 MG capsule Take 37.5 mg by mouth every morning.   valACYclovir 1000 MG tablet Commonly known as:  VALTREX Take 2,000 mg by mouth 2 (two) times daily as needed (fever blisters).      Follow-up Information    Jacqulynn Cadet, MD Follow up.   Specialties:  Interventional Radiology, Radiology Why:  Schedulers will contact you with follow-up information.  Contact information: [address] [place] [address] [phone]            Electronically Signed: Docia Barrier, PA 10/14/2018, 5:02 PM   I have spent Greater Than 30 Minutes discharging [place].

## 2018-10-19 ENCOUNTER — Other Ambulatory Visit (HOSPITAL_COMMUNITY): Payer: Self-pay | Admitting: Interventional Radiology

## 2018-10-19 DIAGNOSIS — D250 Submucous leiomyoma of uterus: Secondary | ICD-10-CM

## 2018-11-03 ENCOUNTER — Encounter: Payer: Self-pay | Admitting: *Deleted

## 2018-11-03 ENCOUNTER — Ambulatory Visit
Admission: RE | Admit: 2018-11-03 | Discharge: 2018-11-03 | Disposition: A | Discharge status: Home / Self Care | Payer: No Typology Code available for payment source | Source: Ambulatory Visit | Attending: Interventional Radiology | Admitting: Interventional Radiology

## 2018-11-03 DIAGNOSIS — D250 Submucous leiomyoma of uterus: Secondary | ICD-10-CM

## 2018-11-03 HISTORY — PX: IR RADIOLOGIST EVAL & MGMT: IMG5224

## 2018-11-03 NOTE — Progress Notes (Signed)
Chief Complaint: Patient was seen in follow-up today for  Chief Complaint  Patient presents with  . Follow-up    1 mo follow up [place]     at the request of [place]  Referring Physician(s): Arvin Abello  History of Present Illness: Rebecca Campos is a 42 y.o. female  With a history of symptomatic uterine fibroids who presents today 1 month status post superior hypogastric nerve block and bilateral uterine artery embolization. She is doing very well and feels fully recovered.  She has had some spotting but no true menstrual cycle yet.   Denies hand or finger pain, chest pain, SOB or other focal symptoms.    Past Medical History:  Diagnosis Date  . Dysrhythmia    svt post op thyroid  2013  . Fibroids   . GERD (gastroesophageal reflux disease)   . Goiter   . Headache(784.0)   . History of kidney stones   . Hypothyroidism     Past Surgical History:  Procedure Laterality Date  . BREAST REDUCTION SURGERY  95  . BREAST SURGERY    . CYSTOSCOPY W/ STONE MANIPULATION    . IR ANGIOGRAM PELVIS SELECTIVE OR SUPRASELECTIVE  10/13/2018  . IR ANGIOGRAM PELVIS SELECTIVE OR SUPRASELECTIVE  10/13/2018  . IR ANGIOGRAM SELECTIVE EACH ADDITIONAL VESSEL  10/13/2018  . IR ANGIOGRAM SELECTIVE EACH ADDITIONAL VESSEL  10/13/2018  . IR EMBO TUMOR ORGAN ISCHEMIA INFARCT INC GUIDE ROADMAPPING  10/13/2018  . IR FLUORO GUIDED NEEDLE PLC ASPIRATION/INJECTION LOC  10/13/2018  . IR RADIOLOGIST EVAL & MGMT  07/28/2018  . IR US GUIDE VASC ACCESS LEFT  10/13/2018  . MYOMECTOMY  06  . THYROIDECTOMY  09/14/2012   Procedure: THYROIDECTOMY;  Surgeon: Thornell Sartorius, MD;  Location: [facility];  Service: ENT;  Laterality: Right;  Right subtotal thyroidectomy   . THYROIDECTOMY  05/08/2015  . THYROIDECTOMY N/A 05/08/2015   Procedure: COMPLETION THYROIDECTOMY;  Surgeon: Leta Baptist, MD;  Location: [facility];  Service: ENT;  Laterality: N/A;  . TONSILLECTOMY  92    Allergies: Methadone;  Hydrocodone-acetaminophen; Percocet [oxycodone-acetaminophen]; and Propoxyphene  Medications: Prior to Admission medications   Medication Sig Start Date End Date Taking? Authorizing Provider  cetirizine (ZYRTEC) 10 MG tablet Take 10 mg by mouth daily as needed for allergies.   Yes [provider]  cetirizine-pseudoephedrine (ZYRTEC-D) 5-120 MG tablet Take 1 tablet by mouth daily as needed for allergies.   Yes [provider]  diphenhydrAMINE (BENADRYL) 25 MG tablet Take 25 mg by mouth at bedtime as needed for allergies.   Yes [provider]  ibuprofen (ADVIL,MOTRIN) 200 MG tablet Take 600 mg by mouth daily as needed (menstrual cramps).   Yes [provider]  levothyroxine (SYNTHROID, LEVOTHROID) 137 MCG tablet Take 137 mcg by mouth daily before breakfast.   Yes [provider]  Melatonin 10 MG TABS Take 10 mg by mouth at bedtime as needed (sleep).    Yes [provider]  omeprazole (PRILOSEC OTC) 20 MG tablet Take 20 mg by mouth daily.   Yes [provider]  OVER THE COUNTER MEDICATION Take 2 tablets by mouth at bedtime. [place] Prenatal Mutlivitamin Adult Gummy with DHA & Folic Acid   Yes [provider]  valACYclovir (VALTREX) 1000 MG tablet Take 2,000 mg by mouth 2 (two) times daily as needed (fever blisters).   Yes [provider]  phentermine 37.5 MG capsule Take 37.5 mg by mouth every morning.    [provider]  No family history on file.  Social History   Socioeconomic History  . Marital status: Single    Spouse name: Not on file  . Number of children: Not on file  . Years of education: Not on file  . Highest education level: Not on file  Occupational History  . Not on file  Social Needs  . Financial resource strain: Not on file  . Food insecurity:    Worry: Not on file    Inability: Not on file  . Transportation needs:    Medical: Not on file    Non-medical: Not on file    Tobacco Use  . Smoking status: Never Smoker  . Smokeless tobacco: Never Used  Substance and Sexual Activity  . Alcohol use: No  . Drug use: No  . Sexual activity: Not on file  Lifestyle  . Physical activity:    Days per week: Not on file    Minutes per session: Not on file  . Stress: Not on file  Relationships  . Social connections:    Talks on phone: Not on file    Gets together: Not on file    Attends religious service: Not on file    Active member of club or organization: Not on file    Attends meetings of clubs or organizations: Not on file    Relationship status: Not on file  Other Topics Concern  . Not on file  Social History Narrative  . Not on file    Review of Systems: A 12 point ROS discussed and pertinent positives are indicated in the HPI above.  All other systems are negative.  Review of Systems  Vital Signs: Ht 5\' 1"  (1.549 m)   Wt 93 kg   BMI 38.73 kg/m   Physical Exam  Constitutional: She is oriented to person, place, and time. She appears well-developed and well-nourished. No distress.  HENT:  Head: Normocephalic and atraumatic.  Eyes: No scleral icterus.  Cardiovascular: Normal rate.  Pulses:      Radial pulses are 1+ on the left side.  Well healed radial puncture.   Pulmonary/Chest: Effort normal.  Abdominal: Soft. There is no tenderness.  Neurological: She is alert and oriented to person, place, and time.  Skin: Skin is warm and dry.  Psychiatric: She has a normal mood and affect. Her behavior is normal.  Nursing note and vitals reviewed.     Labs:  CBC: Recent Labs    10/13/18 0755  WBC 5.9  HGB 11.9*  HCT 38.5  PLT 380    COAGS: Recent Labs    10/13/18 0755  INR 0.89  APTT 29    BMP: Recent Labs    10/13/18 0755  NA 141  K 3.6  CL 108  CO2 25  GLUCOSE 97  BUN 12  CALCIUM 9.6  CREATININE 0.69  GFRNONAA >60  GFRAA >60    LIVER FUNCTION TESTS: No results for input(s): BILITOT, AST, ALT, ALKPHOS, PROT,  ALBUMIN in the last 8760 hours.  TUMOR MARKERS: No results for input(s): AFPTM, CEA, CA199, CHROMGRNA in the last 8760 hours.  Assessment and Plan:  Doing well 1 month post bilateral [place].  Recovery was uneventful and she has returned to full activities.    1.) Phone call check up in 3 months 2.) Return to clinic in 6 months  Electronically Signed: Laurence Ferrari, [place] 11/03/2018, 3:39 PM   I spent a total of  15 Minutes in face to face in clinical consultation, greater than  50% of which was counseling/coordinating care for uterine fibroids.

## 2019-06-19 ENCOUNTER — Other Ambulatory Visit: Payer: Self-pay | Admitting: Interventional Radiology

## 2019-06-19 DIAGNOSIS — D250 Submucous leiomyoma of uterus: Secondary | ICD-10-CM

## 2019-06-20 ENCOUNTER — Other Ambulatory Visit: Payer: Self-pay

## 2019-06-20 ENCOUNTER — Ambulatory Visit
Admission: RE | Admit: 2019-06-20 | Discharge: 2019-06-20 | Disposition: A | Discharge status: Home / Self Care | Payer: No Typology Code available for payment source | Source: Ambulatory Visit | Attending: Interventional Radiology | Admitting: Interventional Radiology

## 2019-06-20 ENCOUNTER — Encounter: Payer: Self-pay | Admitting: *Deleted

## 2019-06-20 DIAGNOSIS — D250 Submucous leiomyoma of uterus: Secondary | ICD-10-CM

## 2019-06-20 HISTORY — PX: IR RADIOLOGIST EVAL & MGMT: IMG5224

## 2019-06-20 NOTE — Progress Notes (Signed)
Chief Complaint: Patient was consulted remotely today (TeleHealth) for uterine fibroids at the request of [place].    Referring Physician(s): Abigael Mogle  History of Present Illness: Rebecca Campos is a 43 y.o. female with a history of symptomatic uterine fibroids.  Her primary symptoms were menorrhagia and bulk related symptoms.  She underwent bilateral uterine artery embolization in October 2019.  Her postoperative course was uneventful and she recovered fully.  Now, we visit via teleconference for 7-month follow-up evaluation.  Our evaluation is slightly delayed secondary to the [place] pandemic.  [name] reports that she is doing extremely well.  Last month was her first challenging cycle, however this month she appears to be skipping her cycle.  Overall, she has less bleeding and less pressure and bulk related symptoms.  She denies any acute issues.  Past Medical History:  Diagnosis Date  . Dysrhythmia    svt post op thyroid  2013  . Fibroids   . GERD (gastroesophageal reflux disease)   . Goiter   . Headache(784.0)   . History of kidney stones   . Hypothyroidism     Past Surgical History:  Procedure Laterality Date  . BREAST REDUCTION SURGERY  95  . BREAST SURGERY    . CYSTOSCOPY W/ STONE MANIPULATION    . IR ANGIOGRAM PELVIS SELECTIVE OR SUPRASELECTIVE  10/13/2018  . IR ANGIOGRAM PELVIS SELECTIVE OR SUPRASELECTIVE  10/13/2018  . IR ANGIOGRAM SELECTIVE EACH ADDITIONAL VESSEL  10/13/2018  . IR ANGIOGRAM SELECTIVE EACH ADDITIONAL VESSEL  10/13/2018  . IR EMBO TUMOR ORGAN ISCHEMIA INFARCT INC GUIDE ROADMAPPING  10/13/2018  . IR FLUORO GUIDED NEEDLE PLC ASPIRATION/INJECTION LOC  10/13/2018  . IR RADIOLOGIST EVAL & MGMT  07/28/2018  . IR RADIOLOGIST EVAL & MGMT  11/03/2018  . IR US GUIDE VASC ACCESS LEFT  10/13/2018  . MYOMECTOMY  06  . THYROIDECTOMY  09/14/2012   Procedure: THYROIDECTOMY;  Surgeon: Thornell Sartorius, MD;  Location: [facility];  Service: ENT;   Laterality: Right;  Right subtotal thyroidectomy   . THYROIDECTOMY  05/08/2015  . THYROIDECTOMY N/A 05/08/2015   Procedure: COMPLETION THYROIDECTOMY;  Surgeon: Leta Baptist, MD;  Location: [facility];  Service: ENT;  Laterality: N/A;  . TONSILLECTOMY  92    Allergies: Methadone, Hydrocodone-acetaminophen, Percocet [oxycodone-acetaminophen], and Propoxyphene  Medications: Prior to Admission medications   Medication Sig Start Date End Date Taking? Authorizing Provider  cetirizine (ZYRTEC) 10 MG tablet Take 10 mg by mouth daily as needed for allergies.    [provider]  cetirizine-pseudoephedrine (ZYRTEC-D) 5-120 MG tablet Take 1 tablet by mouth daily as needed for allergies.    [provider]  diphenhydrAMINE (BENADRYL) 25 MG tablet Take 25 mg by mouth at bedtime as needed for allergies.    [provider]  ibuprofen (ADVIL,MOTRIN) 200 MG tablet Take 600 mg by mouth daily as needed (menstrual cramps).    [provider]  levothyroxine (SYNTHROID, LEVOTHROID) 137 MCG tablet Take 137 mcg by mouth daily before breakfast.    [provider]  Melatonin 10 MG TABS Take 10 mg by mouth at bedtime as needed (sleep).     [provider]  omeprazole (PRILOSEC OTC) 20 MG tablet Take 20 mg by mouth daily.    [provider]  OVER THE COUNTER MEDICATION Take 2 tablets by mouth at bedtime. Gilead Prenatal Mutlivitamin Adult Gummy with DHA & Folic Acid    [provider]  phentermine 37.5 MG capsule Take 37.5 mg  by mouth every morning.    [provider]  valACYclovir (VALTREX) 1000 MG tablet Take 2,000 mg by mouth 2 (two) times daily as needed (fever blisters).    [provider]     No family history on file.  Social History   Socioeconomic History  . Marital status: Single    Spouse name: Not on file  . Number of children: Not on file  . Years of education: Not on file  . Highest education level: Not on file   Occupational History  . Not on file  Social Needs  . Financial resource strain: Not on file  . Food insecurity    Worry: Not on file    Inability: Not on file  . Transportation needs    Medical: Not on file    Non-medical: Not on file  Tobacco Use  . Smoking status: Never Smoker  . Smokeless tobacco: Never Used  Substance and Sexual Activity  . Alcohol use: No  . Drug use: No  . Sexual activity: Not on file  Lifestyle  . Physical activity    Days per week: Not on file    Minutes per session: Not on file  . Stress: Not on file  Relationships  . Social Herbalist on phone: Not on file    Gets together: Not on file    Attends religious service: Not on file    Active member of club or organization: Not on file    Attends meetings of clubs or organizations: Not on file    Relationship status: Not on file  Other Topics Concern  . Not on file  Social History Narrative  . Not on file    Review of Systems  Review of Systems: A 12 point ROS discussed and pertinent positives are indicated in the HPI above.  All other systems are negative.  Physical Exam No direct physical exam was performed (except for noted visual exam findings with Video Visits).   Vital Signs: There were no vitals taken for this visit.  Imaging: No results found.  Labs:  CBC: Recent Labs    10/13/18 0755  WBC 5.9  HGB 11.9*  HCT 38.5  PLT 380    COAGS: Recent Labs    10/13/18 0755  INR 0.89  APTT 29    BMP: Recent Labs    10/13/18 0755  NA 141  K 3.6  CL 108  CO2 25  GLUCOSE 97  BUN 12  CALCIUM 9.6  CREATININE 0.69  GFRNONAA >60  GFRAA >60    LIVER FUNCTION TESTS: No results for input(s): BILITOT, AST, ALT, ALKPHOS, PROT, ALBUMIN in the last 8760 hours.  TUMOR MARKERS: No results for input(s): AFPTM, CEA, CA199, CHROMGRNA in the last 8760 hours.  Assessment and Plan:  Doing very well 8 months status post bilateral uterine artery embolization.  Her cycles  and bulk symptoms have become much more manageable.  She is extremely happy with her result.  No further follow-up required.  [name] knows that we are always here for her should she develop recurrent symptoms in the future.  Thank you for this interesting consult.  I greatly enjoyed meeting [name] P Menzel and look forward to participating in their care.  A copy of this report was sent to the requesting provider on this date.  Electronically Signed: Jacqulynn Cadet 06/20/2019, 3:28 PM   I spent a total of  15 Minutes in remote  clinical consultation, greater than 50%  of which was counseling/coordinating care for uterine fibroids.    Visit type: Audio and video ([employer] WebEx).   Alternative for in-person consultation at [facility], [address] [place], [place], [place]. This visit type was conducted due to national recommendations for restrictions regarding the COVID-19 Pandemic (e.g. social distancing).  This format is felt to be most appropriate for this patient at this time.  All issues noted in this document were discussed and addressed.

## 2020-07-03 IMAGING — US IR ANGIO/ADD [PERSON_NAME]
1 series · 3 of 3 positions shown · non-contrast
Comparison: none

CLINICAL DATA: 42-year-old female with symptomatic uterine
fibroids.
TECHNIQUE: Informed consent was obtained from the patient following explanation
of the procedure, risks, benefits and alternatives. The patient
understands, agrees and consents for the procedure. All questions
were addressed. A time out was performed.

[Series 1: ir (id) (id)/(id)/(id) · 3 of 3 slices shown]
[im 1/3]
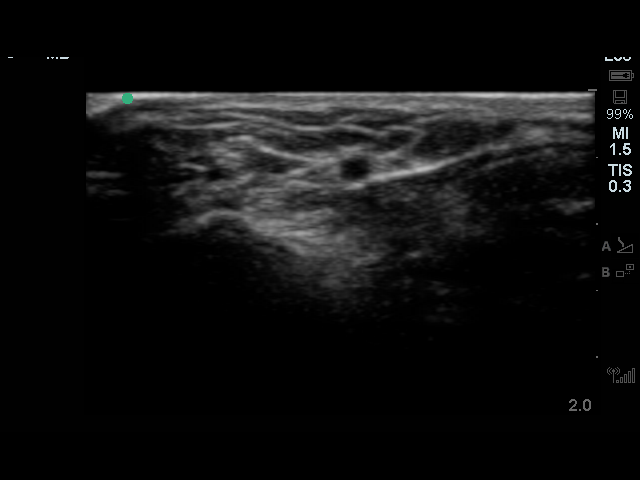
[im 2/3]
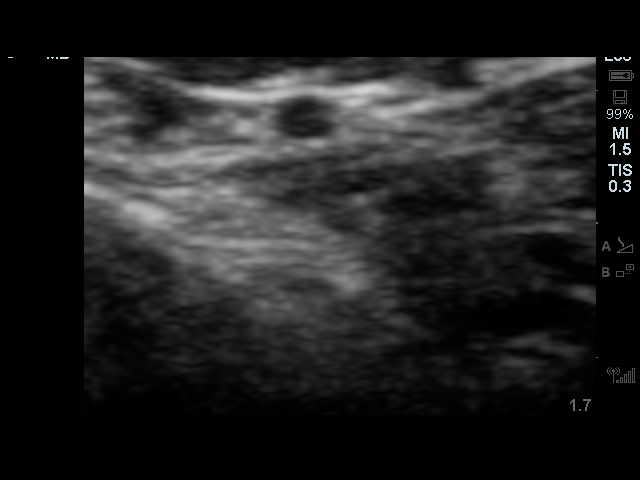
[im 3/3]
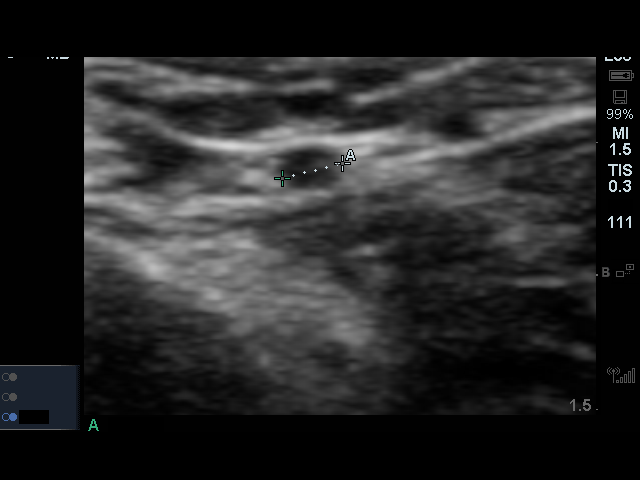

[3 of 3 positions shown; findings below may reference images not displayed]

EXAM:
IR ULTRASOUND GUIDANCE VASC ACCESS LEFT; PELVIC SELECTIVE
ARTERIOGRAPHY; IR EMBO TUMOR ORGAN ISCHEMIA INFARCT INC GUIDE
ROADMAPPING; ADDITIONAL ARTERIOGRAPHY; IR FLUORO GUIDE NEEDLE
PLACEMENT /BIOPSY

Date: 10/13/2018

[DATE]. Ultrasound-guided access left radial artery
2. Aortogram
3. Fluoroscopic guided superior hypogastric nerve block
4. Catheterization of the left internal iliac artery with
arteriogram
5. Catheterization of the left uterine artery with arteriogram
6. Particle embolization of the left uterine artery to near stasis
7. Catheterization of the right internal iliac artery with
arteriogram
8. Catheterization of the right uterine artery with arteriogram
9. Particle embolization of the right uterine artery to near stasis

PROCEDURE:
1. Ultrasound-guided puncture left radial artery
2. Catheterization of the left internal iliac artery with
arteriogram
3. Catheterization of the uterine artery with arteriogram
4. Particle embolization of the left uterine artery to near stasis
5. Catheterization of the right internal iliac artery with
arteriogram
6. Catheterization of the right uterine artery with arteriogram
7. Particle embolization of the right uterine artery to near stasis
8. Limited right common femoral arteriogram
9. Application of Cordis Exoseal device

ANESTHESIA/SEDATION:
Moderate (conscious) sedation was used. 6 mg Versed, 200 fentanyl
were administered intravenously. The patient's vital signs were
monitored continuously by radiology nursing throughout the
procedure.

Sedation Time: 80 minutes

MEDICATIONS:
8 mg Zofran, 1 g Rocephin and 30 mg Toradol administered
intravenously. 20 mL 0.5% ropivacaine injected into the
retroperitoneal space at the L5 level. 4333 units heparin, 400 mcg
nitroglycerin and 2.5 mg verapamil administered intra-arterially at
the radial puncture site.

FLUOROSCOPY TIME:  20 minutes 0 seconds 4486 mGy

CONTRAST:  1mL JXPYTO-788 IOPAMIDOL (JXPYTO-788) INJECTION 61%, 30mL
JXPYTO-788 IOPAMIDOL (JXPYTO-788) INJECTION 61%, 60mL JXPYTO-788
IOPAMIDOL (JXPYTO-788) INJECTION 61%
Maximal barrier sterile technique utilized including caps, mask,
sterile gowns, sterile gloves, large sterile drape, hand hygiene,
and chlorhexidine.

The left radial artery was interrogated with ultrasound and found to
be widely patent and of adequate diameter. An image was obtained and
stored for the medical record. Local anesthesia was attained by
infiltration with 1% lidocaine. Under real-time sonographic
guidance, the vessel was punctured with a 21 gauge micropuncture
needle. Using standard technique, the initial micro needle was
exchanged over a 0.021 micro wire for a hydrophilic 5 French slim
arterial sheath. The sheath was flushed and then a radial cocktail
consisting of 6777 units heparin, 2.5 mg Verapamil and 200 mcg
nitroglycerine was injected through the sheath.

A 120 cm 4 French angled glide catheter was advanced over a Bentson
wire into the abdominal aorta. An abdominal aortogram was then
performed confirming the location of the aortic bifurcation. No
hypertrophic ovarian arteries are identified. The image intensifier
was then angulated until the L5 vertebral body was viewed and Foss.
Local anesthesia was again attained by infiltration with 1%
lidocaine. Under intermittent fluoroscopic guidance, a 21 gauge
Chiba needle was carefully advanced down onto the anterior surface
of the L5 vertebral body. A gentle hand injection of contrast
material was performed in both the frontal and lateral projections.
Contrast material is noted within the retroperitoneal space. No
evidence of venous contamination. A total of 20 mg of 0.5%
ropivacaine was then slowly injected. The needle was removed.

Attention was again turned to the arterial catheter. Using a
glidewire, the catheter was navigated into the left internal iliac
artery. An internal iliac arteriogram was performed. The uterine
artery is hyper trophic. Multiple round tumor blushes project over
the uterus consistent with known multiple uterine fibroids. The 4
French angled glide cath was advanced into the left uterine artery.
This was confirmed by a hand injection of contrast material. The
micro catheter was next advanced into the horizontal segment of the
uterine artery. Angiography demonstrates no visible vesicovaginal
branches.

Particle embolization was then performed using 1 vial of 500 - 700
micron embospheres followed by 1 vials of 700 - 900 micron
embospheres. Embolization was taken to near stasis. A post
embolization arteriogram confirms significant pruning of the distal
vessels and reflux.

The catheter was pulled back and used to select the right internal
iliac artery. Angiography was performed demonstrating the origin of
the hypertrophic right uterine artery. Similar to the contralateral
side, there is tumor blush consistent with multiple right-sided
fibroids.

A Renegade high flow micro catheter was next advanced into the
horizontal segment of the right uterine artery. Catheter location
was confirmed by angiography. No significant vesicovaginal branches
distal to the catheter tip. Particle embolization was then performed
to near stasis using 1 vial of 500 - 700 micron embospheres followed
by 1 vials of 700 - 900 micron embospheres. Post embolization
arteriography confirms adequate pruning of the vessels and
significant reflux.

The catheters were removed. The radial sheath was aspirated and
flushed before being pulled back slightly. 200 mcg of nitroglycerin
were injected through the sheath. The TR band was then applied and
the sheath was removed.

COMPLICATIONS:
None

Estimated blood loss:  0 mL
IMPRESSION: 1. Superior hypogastric nerve block.
2. Successful bilateral uterine artery embolization.

## 2021-05-21 DEATH — deceased
